# Patient Record
Sex: Male | Born: 1960 | Race: White | Hispanic: Yes | Marital: Married | Smoking: Never smoker
Health system: Southern US, Community
[De-identification: ages and names within clinical notes are randomized; demographics above are authoritative.]

## PROBLEM LIST (undated history)

## (undated) DIAGNOSIS — N390 Urinary tract infection, site not specified: Secondary | ICD-10-CM

## (undated) DIAGNOSIS — N289 Disorder of kidney and ureter, unspecified: Secondary | ICD-10-CM

## (undated) DIAGNOSIS — I471 Supraventricular tachycardia, unspecified: Secondary | ICD-10-CM

## (undated) HISTORY — DX: Supraventricular tachycardia, unspecified: I47.10

## (undated) HISTORY — PX: KIDNEY TRANSPLANT: SHX239

## (undated) HISTORY — DX: Disorder of kidney and ureter, unspecified: N28.9

## (undated) HISTORY — DX: Urinary tract infection, site not specified: N39.0

---

## 1997-05-09 ENCOUNTER — Other Ambulatory Visit: Admission: RE | Admit: 1997-05-09 | Discharge: 1997-05-09 | Payer: Self-pay | Admitting: Nephrology

## 1997-05-11 ENCOUNTER — Other Ambulatory Visit: Admission: RE | Admit: 1997-05-11 | Discharge: 1997-05-11 | Payer: Self-pay | Admitting: *Deleted

## 2000-11-04 ENCOUNTER — Encounter: Payer: Self-pay | Admitting: Nephrology

## 2000-11-04 ENCOUNTER — Encounter: Admission: RE | Admit: 2000-11-04 | Discharge: 2000-11-04 | Payer: Self-pay | Admitting: Nephrology

## 2000-11-20 ENCOUNTER — Emergency Department (HOSPITAL_COMMUNITY): Admission: EM | Admit: 2000-11-20 | Discharge: 2000-11-20 | Payer: Self-pay | Admitting: Emergency Medicine

## 2000-12-02 ENCOUNTER — Encounter: Payer: Self-pay | Admitting: Nephrology

## 2000-12-02 ENCOUNTER — Encounter: Admission: RE | Admit: 2000-12-02 | Discharge: 2000-12-02 | Payer: Self-pay | Admitting: Nephrology

## 2001-03-05 ENCOUNTER — Encounter: Admission: RE | Admit: 2001-03-05 | Discharge: 2001-03-05 | Payer: Self-pay | Admitting: Nephrology

## 2001-03-05 ENCOUNTER — Encounter: Payer: Self-pay | Admitting: Nephrology

## 2001-03-23 ENCOUNTER — Ambulatory Visit (HOSPITAL_COMMUNITY): Admission: RE | Admit: 2001-03-23 | Discharge: 2001-03-23 | Payer: Self-pay | Admitting: Gastroenterology

## 2001-04-09 ENCOUNTER — Encounter: Payer: Self-pay | Admitting: Nephrology

## 2001-04-09 ENCOUNTER — Encounter: Admission: RE | Admit: 2001-04-09 | Discharge: 2001-04-09 | Payer: Self-pay | Admitting: Nephrology

## 2004-06-14 ENCOUNTER — Ambulatory Visit (HOSPITAL_COMMUNITY): Admission: RE | Admit: 2004-06-14 | Discharge: 2004-06-14 | Payer: Self-pay | Admitting: Nephrology

## 2005-02-22 ENCOUNTER — Emergency Department (HOSPITAL_COMMUNITY): Admission: EM | Admit: 2005-02-22 | Discharge: 2005-02-22 | Payer: Self-pay | Admitting: Emergency Medicine

## 2006-09-14 ENCOUNTER — Emergency Department (HOSPITAL_COMMUNITY): Admission: EM | Admit: 2006-09-14 | Discharge: 2006-09-15 | Payer: Self-pay | Admitting: Emergency Medicine

## 2006-11-17 ENCOUNTER — Inpatient Hospital Stay (HOSPITAL_COMMUNITY): Admission: EM | Admit: 2006-11-17 | Discharge: 2006-11-18 | Payer: Self-pay | Admitting: Emergency Medicine

## 2007-04-09 ENCOUNTER — Ambulatory Visit (HOSPITAL_BASED_OUTPATIENT_CLINIC_OR_DEPARTMENT_OTHER): Admission: RE | Admit: 2007-04-09 | Discharge: 2007-04-09 | Payer: Self-pay | Admitting: Orthopedic Surgery

## 2009-06-08 IMAGING — CT CT ABDOMEN W/O CM
2 of 4 series · 17 of 46 positions shown, 19 images · non-contrast
Comparison: None

ABDOMEN CT WITHOUT CONTRAST:

CLINICAL DATA: Abdominal pain. History of renal transplant
TECHNIQUE: Multidetector CT imaging of the abdomen and pelvis was performed
following the standard protocol without oral or intravenous contrast.

[Series 2: abd/pelv w/o 5.0 b31f st · axial · non-contrast · 0.75mm/px · z∈[+888,+1328]mm · 14 of 98 slices shown, 16 images]
[im 5/98  soft-tissue]
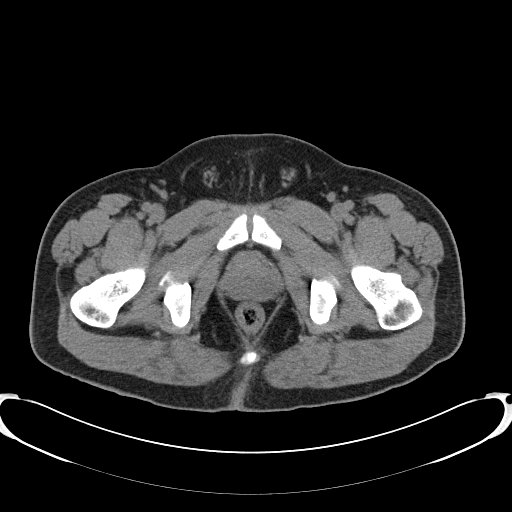
[im 5/98  bone]
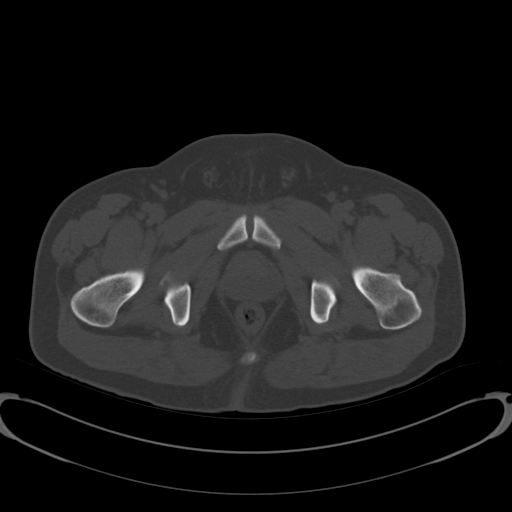
[im 13/98  soft-tissue]
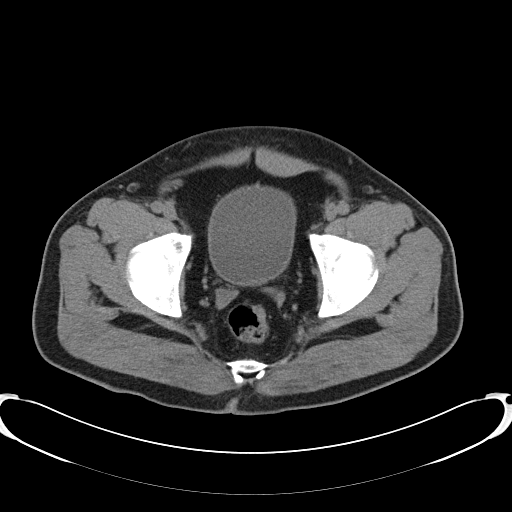
[im 21/98  soft-tissue]
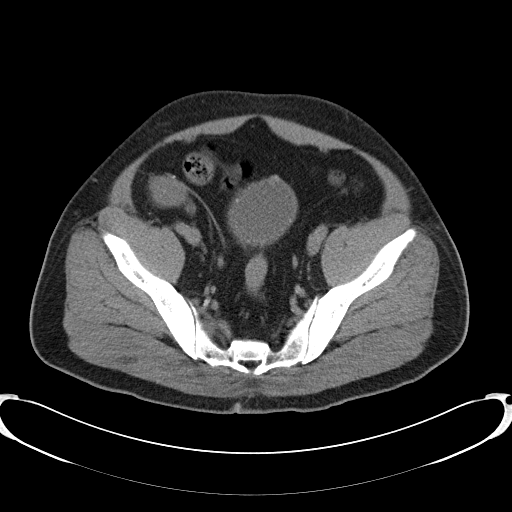
[im 25/98  soft-tissue]
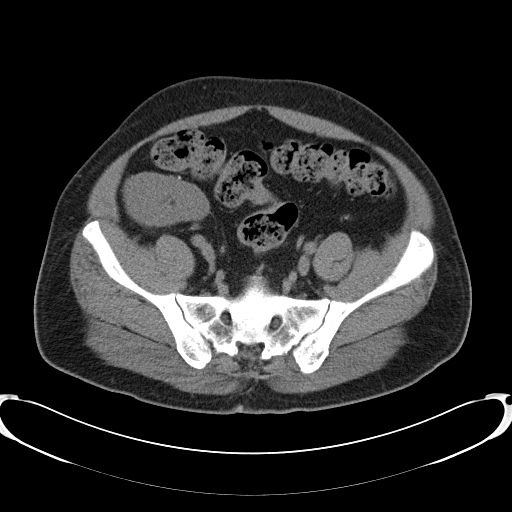
[im 33/98  soft-tissue]
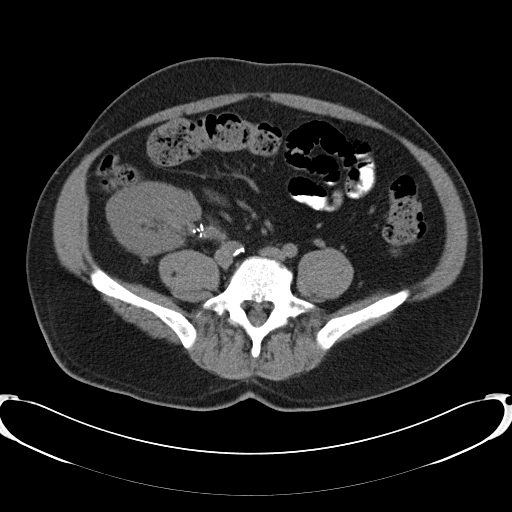
[im 41/98  soft-tissue]
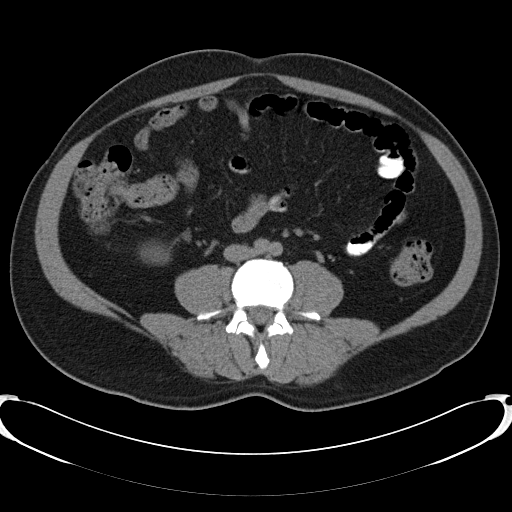
[im 45/98  soft-tissue]
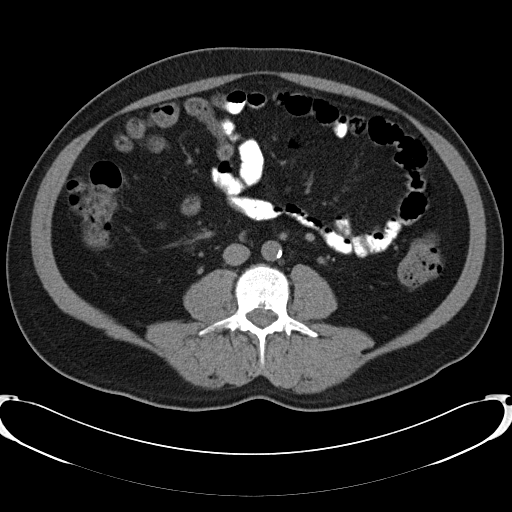
[im 53/98  soft-tissue]
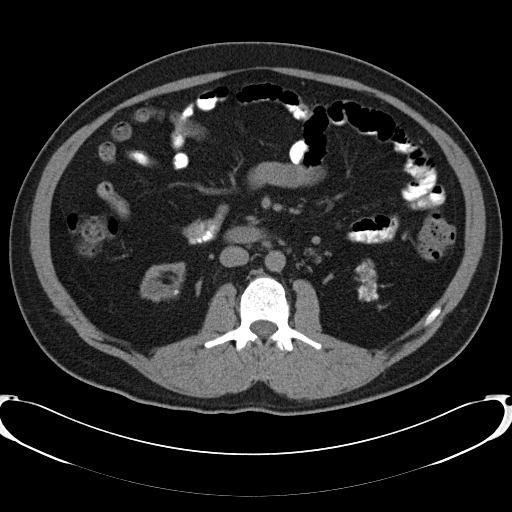
[im 57/98  soft-tissue]
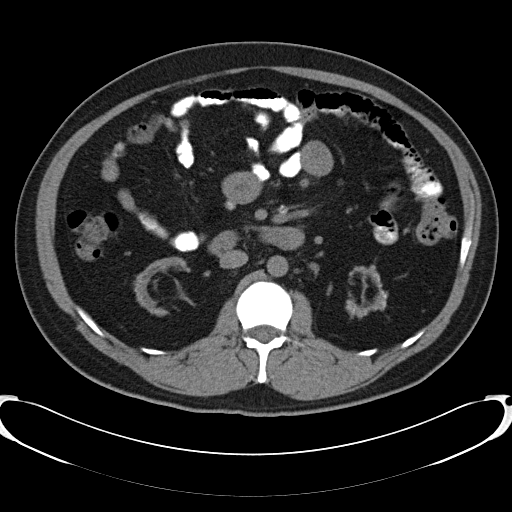
[im 57/98  bone]
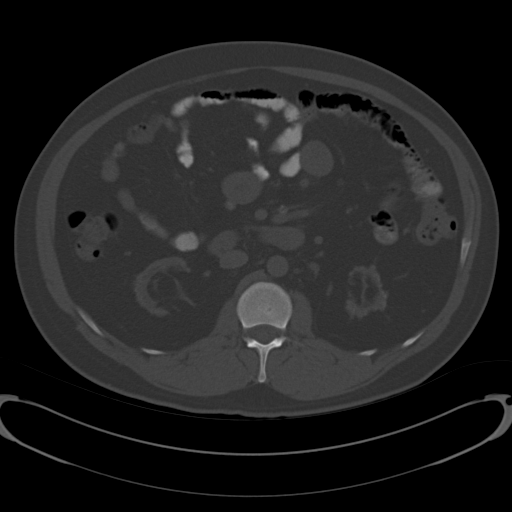
[im 65/98  soft-tissue]
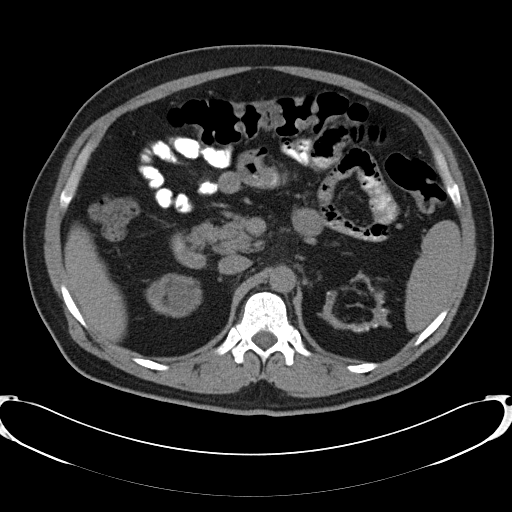
[im 73/98  soft-tissue]
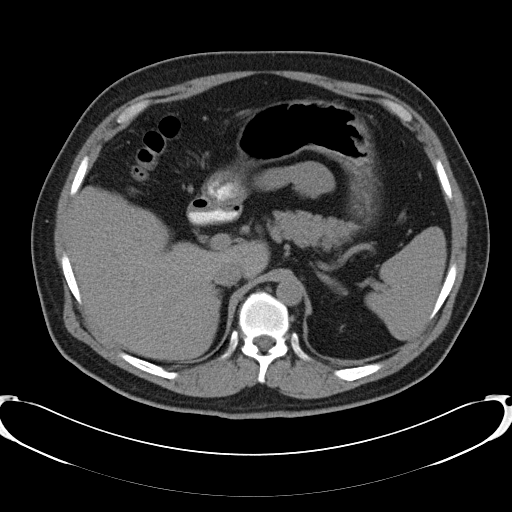
[im 77/98  soft-tissue]
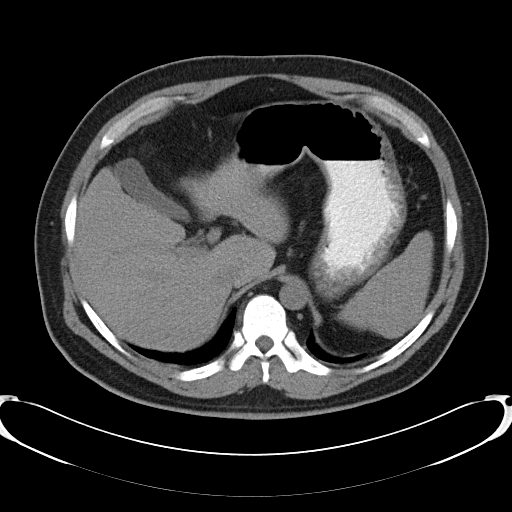
[im 85/98  soft-tissue]
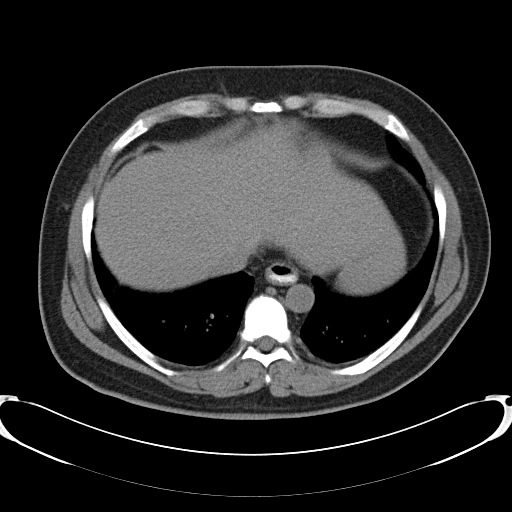
[im 93/98  soft-tissue]
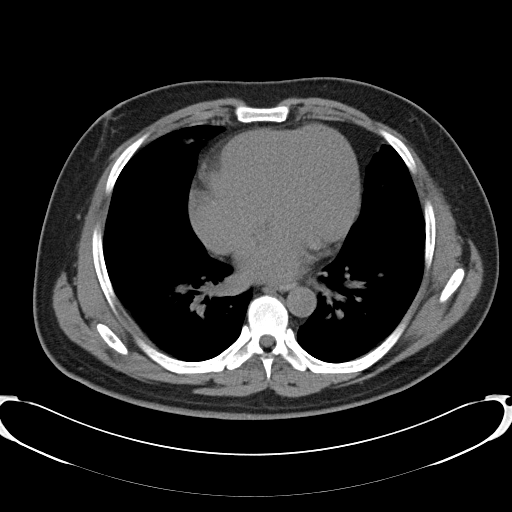

[Series 4: abd/pelv w/o 2.0 spo cor st · coronal · non-contrast · 0.95mm/px · 3 of 136 slices shown]
[im 46/136  soft-tissue]
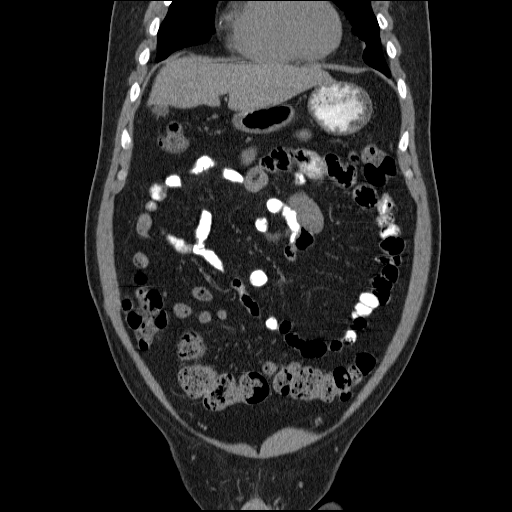
[im 61/136  soft-tissue]
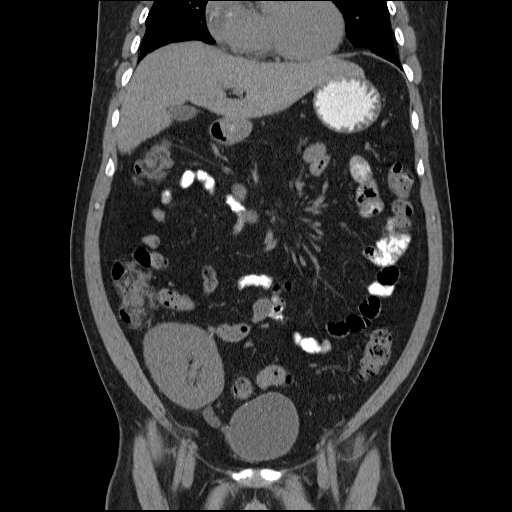
[im 76/136  soft-tissue]
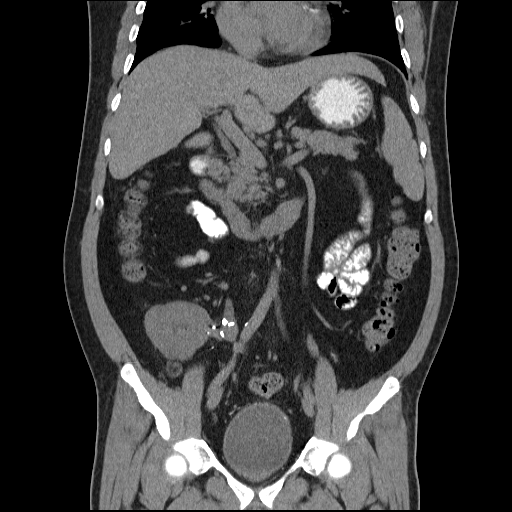

[17 of 46 positions shown; findings below may reference images not displayed]

FINDINGS: Native kidneys are severely atrophic and calcified compatible with
end-stage renal disease. Solid organs otherwise unremarkable. Gallbladder
grossly unremarkable. Bowel grossly unremarkable. No free fluid, free air, or
adenopathy.
IMPRESSION: Severely atrophic kidneys compatible with end-stage renal disease.

PELVIS CT WITHOUT CONTRAST:
FINDINGS: Right lower quadrant renal transplant noted. No hydronephrosis or
evidence of stones. Bladder are grossly unremarkable except for scattered
calcifications within the wall. No free fluid, free air, or adenopathy. Mild
prostate prominence noted. Appendix is normal.
IMPRESSION: Appendix normal.

Scattered calcifications within the bladder wall.

Right lower quadrant renal transplant is unremarkable.

## 2010-06-12 NOTE — H&P (Signed)
NAME:  Adrian Peterson, Adrian Peterson NO.:  [id number]   MEDICAL RECORD NO.:  [id number]          PATIENT TYPE:  INP   LOCATION:  5004                         FACILITY:  [facility]   PHYSICIAN:  Garnetta Buddy, M.D.   DATE OF BIRTH:  November 26, 1960   DATE OF ADMISSION:  11/17/2006  DATE OF DISCHARGE:                              HISTORY & PHYSICAL   PRESENTING COMPLAINT:  Fever, chills, nausea, vomiting for 24 hours.   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  He presented with a 24-hour history of fever, chills,  vomiting x24 hours.   PAST HISTORY:  1. History of renal transplant January 2007.  2. History of secondary parathyroidism.  3. History of hypercalcemia.  4. History of gastroesophageal reflux disease.   ALLERGIES:  No known drug allergies.   MEDICATIONS:  1. Sensipar 30 mg daily.  2. Prograf 4 mg b.i.d.  3. CellCept 150 mg b.i.d.  4. Nexium 40 mg daily.   SOCIAL HISTORY:  Married, no children.  No alcohol, no tobacco.   FAMILY HISTORY:  No end-stage renal disease.   REVIEW OF SYSTEMS:  GENERAL:  Admits to fatigue, weakness, fever, sweats  and chills x24 hours.  EYES:  Denies visual complaints, blurred vision,  double vision.  EARS, NOSE, MOUTH, AND THROAT:  No hearing loss.  No  nasal congestion.  No sinusitis, epistaxis, denies sore throat, or mouth  lesions.  CARDIOVASCULAR:  Denies history of myocardial infarction,  congestive heart failure, heart catheterization, no chest pain.  Denies  any palpitations.  Denies any ankle or leg swelling.  RESPIRATORY:  Denies cough, wheeze, hemoptysis.  No shortness of breath.  No abdominal  pain.  Admits to nausea, loss of appetite, vomiting.  No diarrhea.  No  blood per  rectum.  UROGENITAL:  Urgency, some dysuria, foul-smelling  urine.  Denies of some suprapubic discomfort.  Denies any history of  renal calculi denies any passage of clots.  History of renal transplant,  end-stage renal disease.  NEUROLOGIC: No strokes, seizures, numbness,  tingling, pinpricks, weakness.  DERMATOLOGY:  No skin rashes or itching.  MUSCULOSKELETAL:  No use of nonsteroidal inflammatory drugs.  Complains  of some diffuse arthralgias.  Denies any gout.  HEMATOLOGY/ONCOLOGY:  No  history of cancer, DVT, or pulmonary embolus.  ENDOCRINE:  No history of  diabetes, thyroid disease, or adrenal disease.   PHYSICAL EXAM:  GENERAL:  An alert, very pleasant gentleman, resting  comfortably in bed, answering questions appropriately.  VITAL SIGNS:  Blood pressure 130/80, pulse 75, temperature 100.5,  saturation 97% on room air.  HEAD AND EYES:  Normocephalic, atraumatic.  Pupils round and reactive.  Funduscopic evaluation unremarkable.  No hemorrhages or exudates.  No  emboli.  EARS, NOSE, MOUTH, AND THROAT:  External appearance was normal.  Conjunctivae was normal without any evidence of hemorrhages.  Extraocular movements are intact.  Oropharynx was clear.  Mucosa was dry  nasal mucosa was clear.  NECK:  Supple, no adenopathy.  JVP was not elevated.  CARDIOVASCULAR:  Regular rate and rhythm with a faint systolic murmur  heard in the left sternal region did not radiate.  RESPIRATORY:  Lung fields were clear to auscultation.  No wheezes or  rales to percussion.  Resonant throughout.  ABDOMEN:  Soft, nontender.  Transplanted organ palpable in the right  lower quadrant.  It was mildly tender on deep palpation.  Bowel sounds  were present.  EXTREMITIES:  No cyanosis, clubbing or edema.  GENITAL EVALUATION: Revealed no testicular swelling or tenderness.   MOST RECENT LABS:  WBC 11.3, creatinine 1.2.  Urinalysis 21-50 wbc's,  calcium 10.5   ASSESSMENT AND PLAN:  1. Status post renal  transplantation suspicious for urinary tract      infection or pyelonephritis, does not appear toxic or in shock.      Will admit for sedation and intravenous, and continue antibiotic      therapy.  2. Urinary tract infection.  Will start Cipro 500 mg twice daily.      There is a drug-to-drug interaction with Prograf, will need to keep      this in mind, and check levels frequently.  3. Dehydration.  Continue IV fluids D5 normal saline at 75 mL.  4. Hypercalcemia continue Sensipar 30 mg daily.  Does have a tendency      to cause GI side effects, so will observe for GI side effects with      Sensipar.      Garnetta Buddy, M.D.  Electronically Signed     MWW/MEDQ  D:  11/17/2006  T:  11/18/2006  Job:  [id number]

## 2010-06-12 NOTE — Op Note (Signed)
NAME:  [name], [name]       ACCOUNT NO.:  [id number]   MEDICAL RECORD NO.:  [id number]          PATIENT TYPE:  AMB   LOCATION:  DSC                          FACILITY:  [facility]   PHYSICIAN:  Katy Fitch. Sypher, M.D. 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   POSTOPERATIVE DIAGNOSIS:  Chronic granulation tissue, rule out pyogenic  granuloma.  A small brier was removed from the finger, but no large  splinter was identified.   OPERATION:  Incision and drainage of left index fingertip with  marsupialization of chronic pyogenic granuloma wound.   OPERATIONS:  Josephine Igo, M.D.   ASSISTANT:  Annye Rusk, P.A.-C.   ANESTHESIA:  2% lidocaine metacarpal head level block, left index  finger.  No supplemental sedation.  This was performed as a minor  operating room case at the [facility].   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  He is on Prograf,  CellCept, and another medication from his attending nephrologist, Dr.  Darrick Penna.   He has had a six-week history of a foreign body in his left index  fingertip that he has tried removed by partial nail resection and  working with the needle at home.  Fortunately, he has not developed  cellulitis.  He has, however, had increasing pain and developed a  draining wound with granulation tissue.  He was seen at a urgent care  facility on the evening of April 08, 2007 and advised to seek an urgent  hand surgery consult.  The primary indication for urgent consult was  concern about infection while on his rejection prevention medications  following his renal transplant   Mr. Dains-Ramirez was advised to come to the [facility] for  an acute hand surgery consult.   On clinical examination, he was noted to be an awake and alert 50-year-  old gentleman.  He is fluent in [place].  Inspection of his finger  revealed that he had removed a portion of his radial nail plate and had  a purplish area of granulation tissue that was draining on the radial  aspect of his nail fold.  He stated he thought he could feel a splinter  within the radial pulp.   We advised him to proceed with a minor operating room procedure  following digital block in which we would anticipate removing more nail,  palpating the nail fold and nail matrix, and attempt to remove his  foreign body.  He states that he believes this is a wood splinter from a  2 x 4.   After informed consent, he is brought to Room 3 of the [facility].   PROCEDURE:  Khaliq Turay was placed in supine position upon the  operating table.  After Betadine prep, a 2% lidocaine metacarpal head  level block was placed at the base of left index finger.  After  10  minutes, excellent anesthesia was achieved.  The left arm was then  prepped with Betadine soap solution and sterilely draped.  The left  index finger was exsanguinated with a gauze wrap and a 1/2-inch Penrose  drain placed at the base of the finger as a digital tourniquet.   After assuring that anesthesia was complete, a Therapist, nutritional was used  to elevate the radial half of the nail plate followed by use of scissors  to resect the nail plate.  The sinus tract was quite superficial.  This  had granulation tissue present.  This was carefully palpated with a  micro curette, and all pyogenic granulation type tissue was removed.  I  carefully followed the sinus tract beneath the nail fold but could not  identify a splinter.  I firmly and carefully palpated the entire  fingertip trying to look for signs of a palpable foreign body.   I did find two small briars in the nail fold that  were removed with a  micro rongeur.  I was unable to find a wood splinter that one would  typically expect from lumber.   I unroofed the area of granulation in a marsupialization effort and  dressed the wound open.   Mr. Mierzejewski will return to the office for follow-up in 3 days.  We will begin saline soaks and allow his wound close by secondary  intention.   For aftercare he is provided prescriptions for Vicodin 1 p.o. q.4-6h.  p.r.n. pain 20 tablets without refill.   We will see him back sooner p.r.n. problems.  He was given a map and  written instructions to return to our office on Monday, April 13, 2007.      Katy Fitch Sypher, M.D.  Electronically Signed     RVS/MEDQ  D:  04/09/2007  T:  04/11/2007  Job:  [id number]

## 2010-11-07 LAB — COMPREHENSIVE METABOLIC PANEL
ALT: 17
AST: 27
Albumin: 3.4 — ABNORMAL LOW
Alkaline Phosphatase: 112
BUN: 17
CO2: 25
Calcium: 10.5
Chloride: 103
Creatinine, Ser: 1.16
GFR calc Af Amer: >60
GFR calc non Af Amer: >60
Glucose, Bld: 113 — ABNORMAL HIGH
Potassium: 4
Sodium: 133 — ABNORMAL LOW
Total Bilirubin: 1.1
Total Protein: 6.7

## 2010-11-07 LAB — CBC
HCT: 42.5
HCT: 44.5
Hemoglobin: 14.6
Hemoglobin: 15.5
MCHC: 34.3
MCHC: 34.8
MCV: 86.1
MCV: 86.7
Platelets: 173
Platelets: 181
RBC: 4.9
RBC: 5.16
RDW: 12.9
RDW: 13
WBC: 10.6 — ABNORMAL HIGH
WBC: 11.3 — ABNORMAL HIGH

## 2010-11-07 LAB — URINALYSIS, ROUTINE W REFLEX MICROSCOPIC
Bilirubin Urine: NEGATIVE
Glucose, UA: NEGATIVE
Ketones, ur: NEGATIVE
Nitrite: POSITIVE — AB
Protein, ur: 30 — AB
Specific Gravity, Urine: 1.023
Urobilinogen, UA: 1
pH: 5.5

## 2010-11-07 LAB — CULTURE, BLOOD (ROUTINE X 2)
Culture: NO GROWTH
Culture: NO GROWTH

## 2010-11-07 LAB — URINE CULTURE: Colony Count: >=100000

## 2010-11-07 LAB — RENAL FUNCTION PANEL
Albumin: 2.9 — ABNORMAL LOW
BUN: 15
CO2: 23
Calcium: 10.2
Chloride: 105
Creatinine, Ser: 1.13
GFR calc Af Amer: >60
GFR calc non Af Amer: >60
Glucose, Bld: 108 — ABNORMAL HIGH
Phosphorus: 2.1 — ABNORMAL LOW
Potassium: 4
Sodium: 132 — ABNORMAL LOW

## 2010-11-07 LAB — DIFFERENTIAL
Basophils Absolute: 0
Basophils Relative: 0
Eosinophils Absolute: 0
Eosinophils Relative: 0
Lymphocytes Relative: 12
Lymphs Abs: 1.4
Monocytes Absolute: 1 — ABNORMAL HIGH
Monocytes Relative: 9
Neutro Abs: 8.9 — ABNORMAL HIGH
Neutrophils Relative %: 78 — ABNORMAL HIGH

## 2010-11-07 LAB — URINE MICROSCOPIC-ADD ON

## 2010-11-09 LAB — COMPREHENSIVE METABOLIC PANEL
ALT: 26
AST: 30
Albumin: 3.6
Alkaline Phosphatase: 141 — ABNORMAL HIGH
BUN: 19
CO2: 26
Calcium: 10.6 — ABNORMAL HIGH
Chloride: 106
Creatinine, Ser: 0.96
GFR calc Af Amer: >60
GFR calc non Af Amer: >60
Glucose, Bld: 134 — ABNORMAL HIGH
Potassium: 3.8
Sodium: 135
Total Bilirubin: 0.9
Total Protein: 6.9

## 2010-11-09 LAB — DIFFERENTIAL
Basophils Absolute: 0
Basophils Relative: 0
Eosinophils Absolute: 0
Eosinophils Relative: 0
Lymphocytes Relative: 7 — ABNORMAL LOW
Lymphs Abs: 0.9
Monocytes Absolute: 0.8 — ABNORMAL HIGH
Monocytes Relative: 6
Neutro Abs: 10.9 — ABNORMAL HIGH
Neutrophils Relative %: 86 — ABNORMAL HIGH

## 2010-11-09 LAB — URINALYSIS, ROUTINE W REFLEX MICROSCOPIC
Bilirubin Urine: NEGATIVE
Glucose, UA: NEGATIVE
Hgb urine dipstick: NEGATIVE
Ketones, ur: NEGATIVE
Nitrite: NEGATIVE
Protein, ur: NEGATIVE
Specific Gravity, Urine: 1.026
Urobilinogen, UA: 0.2
pH: 5.5

## 2010-11-09 LAB — URINE CULTURE
Colony Count: NO GROWTH
Culture: NO GROWTH

## 2010-11-09 LAB — CBC
HCT: 46.9
Hemoglobin: 16.2
MCHC: 34.5
MCV: 86.6
Platelets: 193
RBC: 5.42
RDW: 13.4
WBC: 12.6 — ABNORMAL HIGH

## 2010-11-09 LAB — LIPASE, BLOOD: Lipase: 32

## 2010-11-09 LAB — URINE MICROSCOPIC-ADD ON

## 2012-07-28 DIAGNOSIS — N2581 Secondary hyperparathyroidism of renal origin: Secondary | ICD-10-CM | POA: Insufficient documentation

## 2012-07-28 DIAGNOSIS — Z5181 Encounter for therapeutic drug level monitoring: Secondary | ICD-10-CM | POA: Insufficient documentation

## 2012-07-28 DIAGNOSIS — Z940 Kidney transplant status: Secondary | ICD-10-CM | POA: Insufficient documentation

## 2015-04-20 ENCOUNTER — Ambulatory Visit (INDEPENDENT_AMBULATORY_CARE_PROVIDER_SITE_OTHER): Payer: BLUE CROSS/BLUE SHIELD | Admitting: Physician Assistant

## 2015-04-20 VITALS — BP 118/60 | HR 91 | Temp 98.4°F | Resp 18 | Ht 62.0 in | Wt 168.0 lb

## 2015-04-20 DIAGNOSIS — D849 Immunodeficiency, unspecified: Secondary | ICD-10-CM | POA: Diagnosis not present

## 2015-04-20 DIAGNOSIS — J101 Influenza due to other identified influenza virus with other respiratory manifestations: Secondary | ICD-10-CM

## 2015-04-20 DIAGNOSIS — D899 Disorder involving the immune mechanism, unspecified: Secondary | ICD-10-CM

## 2015-04-20 DIAGNOSIS — R6889 Other general symptoms and signs: Secondary | ICD-10-CM

## 2015-04-20 LAB — POCT INFLUENZA A/B
Influenza A, POC: NEGATIVE
Influenza B, POC: POSITIVE — AB

## 2015-04-20 MED ORDER — HYDROCOD POLST-CPM POLST ER 10-8 MG/5ML PO SUER: 5.0000 mL | Freq: Two times a day (BID) | ORAL | Status: DC | PRN

## 2015-04-20 MED ORDER — GUAIFENESIN ER 1200 MG PO TB12: 1.0000 | ORAL_TABLET | Freq: Two times a day (BID) | ORAL | Status: AC

## 2015-04-20 NOTE — Patient Instructions (Addendum)
  Please push fluids.  Tylenol and Motrin for fever and body aches.    A humidifier can help especially when the air is dry -if you do not have a humidifier you can boil a pot of water on the stove in your home to help with the dry air.    IF you received an x-ray today, you will receive an invoice from [facility]. Please contact [facility] at [phone] with questions or concerns regarding your invoice.   IF you received labwork today, you will receive an invoice from [facility]. Please contact [facility] at [phone] with questions or concerns regarding your invoice.   Our billing staff will not be able to assist you with questions regarding bills from these companies.  You will be contacted with the lab results as soon as they are available. The fastest way to get your results is to activate your My Chart account. Instructions are located on the last page of this paperwork. If you have not heard from us regarding the results in 2 weeks, please contact this office.      

## 2015-04-20 NOTE — Progress Notes (Signed)
   Adrian Peterson  MRN: [id number] DOB: [birth year]  Subjective:  Pt presents to clinic with 5 day ho cold symptoms.  He has a cough that is productive with white sputum.  He has nasal congestion with PND with no rhinorrhea.  Irritated throat.  Subjective fevers and chills.  Myalgias all over.  Could not go to work yesterday because he felt so bad.  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 H/O kidney transplant 07/28/2012  . Secondary hyperparathyroidism (HCC) 07/28/2012    No current outpatient prescriptions on file prior to visit.   No current facility-administered medications on file prior to visit.    No Known Allergies  Review of Systems  Constitutional: Positive for fever and chills.  HENT: Positive for congestion, postnasal drip and sore throat. Negative for rhinorrhea.   Respiratory: Positive for cough. Negative for shortness of breath and wheezing.        No asthma, nonsmoker  Gastrointestinal: Positive for diarrhea.  Musculoskeletal: Positive for myalgias.  Neurological: Positive for headaches.   Objective:  BP 118/60 mmHg  Pulse 91  Temp(Src) 98.4 F (36.9 C)  Resp 18  Ht 5\' 2"  (1.575 m)  Wt 168 lb (76.204 kg)  BMI 30.72 kg/m2  SpO2 98%  Physical Exam  Constitutional: He is oriented to person, place, and time and well-developed, well-nourished, and in no distress.  HENT:  Head: Normocephalic and atraumatic.  Right Ear: Hearing, tympanic membrane, external ear and ear canal normal.  Left Ear: Hearing, tympanic membrane, external ear and ear canal normal.  Nose: Nose normal.  Mouth/Throat: Uvula is midline, oropharynx is clear and moist and mucous membranes are normal.  Eyes: Conjunctivae are normal.  Neck: Normal range of motion.  Cardiovascular: Normal rate, regular rhythm and normal heart sounds.     Pulmonary/Chest: Effort normal and breath sounds normal. He has no wheezes.  Lymphadenopathy:       Head (right side): No tonsillar adenopathy present.       Head (left side): No tonsillar adenopathy present.    He has no cervical adenopathy.       Right: No supraclavicular adenopathy present.       Left: No supraclavicular adenopathy present.  Neurological: He is alert and oriented to person, place, and time. Gait normal.  Skin: Skin is warm and dry.  Psychiatric: Mood, memory, affect and judgment normal.    Results for orders placed or performed in visit on 04/20/15  POCT Influenza A/B  Result Value Ref Range   Influenza A, POC Negative Negative   Influenza B, POC Positive (A) Negative    Assessment and Plan :  Flu-like symptoms - Plan: POCT Influenza A/B  Immunocompromised (HCC)  Influenza B - Plan: chlorpheniramine-HYDROcodone (TUSSIONEX PENNKINETIC ER) 10-8 MG/5ML SUER, Guaifenesin (MUCINEX MAXIMUM STRENGTH) 1200 MG TB12   Pt has influenza B and we discussed the length of illness - due to his immunocompromised status he will be mindful that if at this time in the illness he should not be getting worse and if at any time he starts getting worse he should RTC.  We discusses symptomatic care.  Benny LennertSarah Weber PA-C  [facility] 04/20/2015 1:16 PM

## 2015-11-08 ENCOUNTER — Ambulatory Visit (INDEPENDENT_AMBULATORY_CARE_PROVIDER_SITE_OTHER): Payer: BLUE CROSS/BLUE SHIELD | Admitting: Podiatry

## 2015-11-08 ENCOUNTER — Encounter: Payer: Self-pay | Admitting: Podiatry

## 2015-11-08 ENCOUNTER — Ambulatory Visit (INDEPENDENT_AMBULATORY_CARE_PROVIDER_SITE_OTHER): Payer: BLUE CROSS/BLUE SHIELD

## 2015-11-08 VITALS — BP 132/69 | HR 86 | Resp 16 | Ht 62.0 in | Wt 169.0 lb

## 2015-11-08 DIAGNOSIS — M722 Plantar fascial fibromatosis: Secondary | ICD-10-CM

## 2015-11-08 DIAGNOSIS — M79672 Pain in left foot: Secondary | ICD-10-CM

## 2015-11-08 MED ORDER — TRIAMCINOLONE ACETONIDE 10 MG/ML IJ SUSP
10.0000 mg | Freq: Once | INTRAMUSCULAR | Status: AC
  Administered 2015-11-08: 10 mg

## 2015-11-08 NOTE — Progress Notes (Signed)
   Subjective:    Patient ID: Adrian Peterson, male    DOB: 17-Feb-1960, 55 y.o.   MRN: [id number]  HPI Chief Complaint  Patient presents with  . Foot Pain    Left foot; heel; pt stated, "Hurts on and off all day for the past 2 months"      Review of Systems  All other systems reviewed and are negative.      Objective:   Physical Exam        Assessment & Plan:

## 2015-11-08 NOTE — Progress Notes (Signed)
Subjective:     Patient ID: Baldo Ashsidro Dantonio-Ramirez, male   DOB: 1960/08/05, 55 y.o.   MRN: [id number]  HPI patient presents stating I'm having a lot of pain still in my heel left when I try to be active   Review of Systems  All other systems reviewed and are negative.      Objective:   Physical Exam  Constitutional: He is oriented to person, place, and time.  Cardiovascular: Intact distal pulses.   Musculoskeletal: Normal range of motion.  Neurological: He is oriented to person, place, and time.  Skin: Skin is warm.  Nursing note and vitals reviewed.  neurovascular status intact muscle strength adequate range of motion within normal limits with patient found to have exquisite discomfort plantar aspect left heel at the insertional point of the tendon into the calcaneus. Patient's found to have good digital perfusion is well oriented 3     Assessment:     Acute plantar fasciitis left with inflammation fluid around the medial band    Plan:     H&P x-rays reviewed and today I injected the plantar fascial left 3 mg Kenalog 5 mg Xylocaine and applied fascial brace. Gave instructions on physical therapy and reappoint to recheck  X-ray report indicate spur formation with no indications of stress fracture arthritis

## 2015-11-08 NOTE — Patient Instructions (Signed)

## 2015-11-22 ENCOUNTER — Ambulatory Visit (INDEPENDENT_AMBULATORY_CARE_PROVIDER_SITE_OTHER): Payer: BLUE CROSS/BLUE SHIELD | Admitting: Podiatry

## 2015-11-22 ENCOUNTER — Encounter: Payer: Self-pay | Admitting: Podiatry

## 2015-11-22 DIAGNOSIS — M722 Plantar fascial fibromatosis: Secondary | ICD-10-CM | POA: Diagnosis not present

## 2015-11-22 DIAGNOSIS — M79672 Pain in left foot: Secondary | ICD-10-CM

## 2015-11-22 MED ORDER — TRIAMCINOLONE ACETONIDE 10 MG/ML IJ SUSP
10.0000 mg | Freq: Once | INTRAMUSCULAR | Status: AC
  Administered 2015-11-22: 10 mg

## 2015-11-23 NOTE — Progress Notes (Signed)
Subjective:     Patient ID: Adrian Peterson, male   DOB: 04/08/1960, 55 y.o.   MRN: [id number]  HPI patient states he still is having heel pain but it's improved from previous visit   Review of Systems     Objective:   Physical Exam Neurovascular status intact muscle strength adequate with discomfort in the plantar heel left that's improving but still present    Assessment:     Fasciitis improved but present    Plan:     Reinjected the plantar fascia 3 Milligan Kenalog 5 mill grams Xylocaine advised long-term physical therapy shoe gear modifications and that most likely he will take orthotics to try to support the arch. Reappoint in 4-5 weeks to reevaluate and see response to continue conservative treatment

## 2015-12-27 ENCOUNTER — Encounter: Payer: Self-pay | Admitting: Podiatry

## 2015-12-27 ENCOUNTER — Ambulatory Visit (INDEPENDENT_AMBULATORY_CARE_PROVIDER_SITE_OTHER): Payer: BLUE CROSS/BLUE SHIELD | Admitting: Podiatry

## 2015-12-27 DIAGNOSIS — M722 Plantar fascial fibromatosis: Secondary | ICD-10-CM | POA: Diagnosis not present

## 2015-12-27 DIAGNOSIS — M79672 Pain in left foot: Secondary | ICD-10-CM

## 2015-12-28 NOTE — Progress Notes (Signed)
Subjective:     Patient ID: Adrian Peterson, male   DOB: 1960-12-11, 55 y.o.   MRN: [id number]  HPI patient presents stating my heel is improving but it is still tender   Review of Systems     Objective:   Physical Exam Neurovascular status intact muscle strength is adequate with patient's left heel still been sore when palpated for to long but overall healing well with moderate pain and flatfoot deformity    Assessment:     Plantar fasciitis left improved but present    Plan:     Advised on physical therapy anti-inflammatories and scanned for custom orthotics to reduce plantar pressure. Reappoint when ready

## 2016-01-17 ENCOUNTER — Ambulatory Visit (INDEPENDENT_AMBULATORY_CARE_PROVIDER_SITE_OTHER): Payer: Self-pay | Admitting: Podiatry

## 2016-01-17 DIAGNOSIS — M722 Plantar fascial fibromatosis: Secondary | ICD-10-CM

## 2016-01-17 NOTE — Patient Instructions (Signed)

## 2016-02-09 NOTE — Progress Notes (Signed)
Patient presents for orthotic pick up.  Verbal and written break in and wear instructions given.  Patient will follow up in 4 weeks if symptoms worsen or fail to improve. 

## 2017-11-06 DIAGNOSIS — I10 Essential (primary) hypertension: Secondary | ICD-10-CM | POA: Insufficient documentation

## 2018-02-04 ENCOUNTER — Encounter: Payer: Self-pay | Admitting: Internal Medicine

## 2018-02-23 ENCOUNTER — Ambulatory Visit: Payer: Self-pay | Admitting: Internal Medicine

## 2018-04-12 DIAGNOSIS — R200 Anesthesia of skin: Secondary | ICD-10-CM | POA: Insufficient documentation

## 2018-04-12 DIAGNOSIS — E559 Vitamin D deficiency, unspecified: Secondary | ICD-10-CM | POA: Insufficient documentation

## 2018-05-13 DIAGNOSIS — N319 Neuromuscular dysfunction of bladder, unspecified: Secondary | ICD-10-CM | POA: Insufficient documentation

## 2018-10-06 DIAGNOSIS — M47812 Spondylosis without myelopathy or radiculopathy, cervical region: Secondary | ICD-10-CM | POA: Insufficient documentation

## 2018-10-06 DIAGNOSIS — N390 Urinary tract infection, site not specified: Secondary | ICD-10-CM | POA: Insufficient documentation

## 2018-10-06 DIAGNOSIS — I6523 Occlusion and stenosis of bilateral carotid arteries: Secondary | ICD-10-CM | POA: Insufficient documentation

## 2018-11-01 DIAGNOSIS — D849 Immunodeficiency, unspecified: Secondary | ICD-10-CM | POA: Insufficient documentation

## 2018-12-16 DIAGNOSIS — R7301 Impaired fasting glucose: Secondary | ICD-10-CM | POA: Insufficient documentation

## 2020-04-19 ENCOUNTER — Observation Stay (HOSPITAL_COMMUNITY)
Admission: EM | Admit: 2020-04-19 | Discharge: 2020-04-20 | Disposition: A | Discharge status: Home / Self Care | Payer: BLUE CROSS/BLUE SHIELD | Attending: Cardiology | Admitting: Cardiology

## 2020-04-19 ENCOUNTER — Other Ambulatory Visit: Payer: Self-pay

## 2020-04-19 ENCOUNTER — Emergency Department (HOSPITAL_COMMUNITY): Payer: BLUE CROSS/BLUE SHIELD

## 2020-04-19 ENCOUNTER — Encounter (HOSPITAL_COMMUNITY): Payer: Self-pay

## 2020-04-19 DIAGNOSIS — Z79899 Other long term (current) drug therapy: Secondary | ICD-10-CM | POA: Diagnosis not present

## 2020-04-19 DIAGNOSIS — I10 Essential (primary) hypertension: Secondary | ICD-10-CM | POA: Diagnosis not present

## 2020-04-19 DIAGNOSIS — Z20822 Contact with and (suspected) exposure to covid-19: Secondary | ICD-10-CM | POA: Diagnosis not present

## 2020-04-19 DIAGNOSIS — N138 Other obstructive and reflux uropathy: Secondary | ICD-10-CM | POA: Insufficient documentation

## 2020-04-19 DIAGNOSIS — N2581 Secondary hyperparathyroidism of renal origin: Secondary | ICD-10-CM | POA: Diagnosis present

## 2020-04-19 DIAGNOSIS — I471 Supraventricular tachycardia: Principal | ICD-10-CM | POA: Insufficient documentation

## 2020-04-19 DIAGNOSIS — Z940 Kidney transplant status: Secondary | ICD-10-CM | POA: Diagnosis not present

## 2020-04-19 DIAGNOSIS — E211 Secondary hyperparathyroidism, not elsewhere classified: Secondary | ICD-10-CM | POA: Insufficient documentation

## 2020-04-19 LAB — TSH: TSH: 1.854 u[IU]/mL (ref 0.350–4.500)

## 2020-04-19 LAB — CBC
HCT: 46.2 % (ref 39.0–52.0)
Hemoglobin: 15.1 g/dL (ref 13.0–17.0)
MCH: 28.8 pg (ref 26.0–34.0)
MCHC: 32.7 g/dL (ref 30.0–36.0)
MCV: 88.2 fL (ref 80.0–100.0)
Platelets: 241 10*3/uL (ref 150–400)
RBC: 5.24 MIL/uL (ref 4.22–5.81)
RDW: 13.2 % (ref 11.5–15.5)
WBC: 12.2 10*3/uL — ABNORMAL HIGH (ref 4.0–10.5)
nRBC: 0 % (ref 0.0–0.2)

## 2020-04-19 LAB — TROPONIN I (HIGH SENSITIVITY)
Troponin I (High Sensitivity): 12 ng/L (ref <18)
Troponin I (High Sensitivity): 127 ng/L (ref <18)
Troponin I (High Sensitivity): 252 ng/L (ref <18)

## 2020-04-19 LAB — BASIC METABOLIC PANEL
Anion gap: 7 (ref 5–15)
BUN: 25 mg/dL — ABNORMAL HIGH (ref 6–20)
CO2: 19 mmol/L — ABNORMAL LOW (ref 22–32)
Calcium: 10.7 mg/dL — ABNORMAL HIGH (ref 8.9–10.3)
Chloride: 110 mmol/L (ref 98–111)
Creatinine, Ser: 1.22 mg/dL (ref 0.61–1.24)
GFR, Estimated: >60 mL/min (ref >60)
Glucose, Bld: 162 mg/dL — ABNORMAL HIGH (ref 70–99)
Potassium: 4.2 mmol/L (ref 3.5–5.1)
Sodium: 136 mmol/L (ref 135–145)

## 2020-04-19 LAB — MAGNESIUM: Magnesium: 1.9 mg/dL (ref 1.7–2.4)

## 2020-04-19 MED ORDER — ADENOSINE 6 MG/2ML IV SOLN
INTRAVENOUS | Status: AC
  Administered 2020-04-19: 6 mg via INTRAVENOUS
  Filled 2020-04-19: qty 6

## 2020-04-19 NOTE — ED Notes (Signed)
Cards at bedside

## 2020-04-19 NOTE — ED Triage Notes (Signed)
Patient complains of cp and palpitations x 50 minutes. Has had same in past but normally only lat 3-5 minutes per interpretor. Patient alert and oriented, also complains of SOB

## 2020-04-19 NOTE — ED Notes (Signed)
Pt noted to be in SVT upon arrival to room. HR in the 200-210 range. Dr. Wilkie Aye at bedside. 6 mg Adenosine given with conversion to NSR.

## 2020-04-19 NOTE — ED Provider Notes (Signed)
[facility] EMERGENCY DEPARTMENT Provider Note   CSN: [id number] Arrival date & time: 04/19/20  1646     History No chief complaint on file.   Salvador Coupe is a 60 y.o. male.  HPI   60 year old Spanish-speaking male with past medical history of end-stage renal disease status post kidney transplant presents the emergency department with racing heartbeat and chest pain.  Interpreter used.  Patient states that for the past week he has been having episodes of racing heartbeat but usually only last 3 to 5 minutes and then self resolves.  About an hour ago the racing heartbeat started and it did not subside and now he is having chest pain.  Patient denies any history of heart problems or arrhythmias in the past.  He has an AV fistula in the left upper extremity but states he has not required dialysis and receiving his kidney transplant.  He otherwise denies any acute illness in the past week.  History reviewed. No pertinent past medical history.  Patient Active Problem List   Diagnosis Date Noted  . H/O kidney transplant 07/28/2012  . Secondary hyperparathyroidism (HCC) 07/28/2012    History reviewed. No pertinent surgical history.     No family history on file.  Social History   Tobacco Use  . Smoking status: Never Smoker    Home Medications Prior to Admission medications   Medication Sig Start Date End Date Taking? Authorizing Provider  cinacalcet (SENSIPAR) 30 MG tablet Take 30 mg by mouth 2 (two) times daily with a meal.    [provider]  mycophenolate (CELLCEPT) 500 MG tablet Take 500 mg by mouth 2 (two) times daily.    [provider]  tacrolimus (PROGRAF) 1 MG capsule Take 2 mg by mouth 2 (two) times daily.    [provider]    Allergies    Patient has no known allergies.  Review of Systems   Review of Systems  Unable to perform ROS: Acuity of condition    Physical Exam Updated Vital Signs BP (!) 134/93  (BP Location: Right Arm)   Pulse 92   Temp 98.4 F (36.9 C) (Oral)   Resp (!) 23   SpO2 98%   Physical Exam Vitals and nursing note reviewed.  Constitutional:      Appearance: Normal appearance.  HENT:     Head: Normocephalic.     Mouth/Throat:     Mouth: Mucous membranes are moist.  Cardiovascular:     Rate and Rhythm: Regular rhythm. Tachycardia present.  Pulmonary:     Comments: Tachypneic, equal breath sounds Abdominal:     Palpations: Abdomen is soft.     Tenderness: There is no abdominal tenderness.  Musculoskeletal:        General: No swelling.  Skin:    General: Skin is warm.  Neurological:     Mental Status: He is alert and oriented to person, place, and time. Mental status is at baseline.  Psychiatric:        Mood and Affect: Mood normal.     ED Results / Procedures / Treatments   Labs (all labs ordered are listed, but only abnormal results are displayed) Labs Reviewed  CBC - Abnormal; Notable for the following components:      Result Value   WBC 12.2 (*)    All other components within normal limits  BASIC METABOLIC PANEL  MAGNESIUM  TSH  TROPONIN I (HIGH SENSITIVITY)    EKG None  Radiology No results found.  Procedures .Critical Care Performed by: Rozelle Logan, DO Authorized by: Rozelle Logan, DO   Critical care provider statement:    Critical care time (minutes):  45   Critical care was time spent personally by me on the following activities:  Discussions with consultants, evaluation of patient's response to treatment, examination of patient, ordering and performing treatments and interventions, ordering and review of laboratory studies, ordering and review of radiographic studies, pulse oximetry, re-evaluation of patient's condition, obtaining history from patient or surrogate and review of old charts   I assumed direction of critical care for this patient from another provider in my specialty: no       Medications Ordered in  ED Medications  adenosine (ADENOCARD) 6 MG/2ML injection (6 mg Intravenous Given 04/19/20 1704)    ED Course  I have reviewed the triage vital signs and the nursing notes.  Pertinent labs & imaging results that were available during my care of the patient were reviewed by me and considered in my medical decision making (see chart for details).    MDM Rules/Calculators/A&P                          60 year old primarily Spanish-speaking male presents the emergency department accompanied by his spouse for racing heartbeat and chest pain.  On arrival heart rate was over 200, EKG shows SVT with a narrow QRS.  Interpreter used, patient given 6 mg of adenosine.  Had great conversion to normal sinus rhythm with normal intervals.  Chest pain subsided, tachypnea resolved.  Blood work is reassuring, no anemia, no acute electrolyte abnormalities, magnesium is normal, TSH is normal.  First troponin was 12 but this did elevate to 127 on the 90-minute.  Consulted cardiology and they recommend 3-hour troponin and reevaluation.  3-hour troponin is uptrending.  Patient does not appear to have any previous cardiac work-up or echo.  Cardiology has seen the patient and recommends admission.  Patient is amendable to staying.  Patients evaluation and results requires admission for further treatment and care. Patient agrees with admission plan, offers no new complaints and is stable/unchanged at time of admit.  Final Clinical Impression(s) / ED Diagnoses Final diagnoses:  None    Rx / DC Orders ED Discharge Orders    None       Hanah Moultry, Clabe Seal, DO 04/20/20 0000

## 2020-04-20 ENCOUNTER — Observation Stay (HOSPITAL_BASED_OUTPATIENT_CLINIC_OR_DEPARTMENT_OTHER): Payer: BLUE CROSS/BLUE SHIELD

## 2020-04-20 DIAGNOSIS — I471 Supraventricular tachycardia: Secondary | ICD-10-CM

## 2020-04-20 DIAGNOSIS — I350 Nonrheumatic aortic (valve) stenosis: Secondary | ICD-10-CM | POA: Diagnosis not present

## 2020-04-20 DIAGNOSIS — R079 Chest pain, unspecified: Secondary | ICD-10-CM

## 2020-04-20 DIAGNOSIS — I361 Nonrheumatic tricuspid (valve) insufficiency: Secondary | ICD-10-CM

## 2020-04-20 DIAGNOSIS — I340 Nonrheumatic mitral (valve) insufficiency: Secondary | ICD-10-CM | POA: Diagnosis not present

## 2020-04-20 DIAGNOSIS — Z940 Kidney transplant status: Secondary | ICD-10-CM

## 2020-04-20 LAB — ECHOCARDIOGRAM COMPLETE
AR max vel: 2.31 cm2
AV Area VTI: 2.19 cm2
AV Area mean vel: 2.3 cm2
AV Mean grad: 9 mmHg
AV Peak grad: 19.7 mmHg
Ao pk vel: 2.22 m/s
Area-P 1/2: 3.03 cm2
MV VTI: 2.05 cm2
S' Lateral: 3.1 cm

## 2020-04-20 LAB — BASIC METABOLIC PANEL
Anion gap: 3 — ABNORMAL LOW (ref 5–15)
BUN: 24 mg/dL — ABNORMAL HIGH (ref 6–20)
CO2: 19 mmol/L — ABNORMAL LOW (ref 22–32)
Calcium: 9.8 mg/dL (ref 8.9–10.3)
Chloride: 114 mmol/L — ABNORMAL HIGH (ref 98–111)
Creatinine, Ser: 1.2 mg/dL (ref 0.61–1.24)
GFR, Estimated: >60 mL/min (ref >60)
Glucose, Bld: 104 mg/dL — ABNORMAL HIGH (ref 70–99)
Potassium: 4.7 mmol/L (ref 3.5–5.1)
Sodium: 136 mmol/L (ref 135–145)

## 2020-04-20 LAB — CBC
HCT: 42.9 % (ref 39.0–52.0)
Hemoglobin: 13.7 g/dL (ref 13.0–17.0)
MCH: 28.7 pg (ref 26.0–34.0)
MCHC: 31.9 g/dL (ref 30.0–36.0)
MCV: 89.9 fL (ref 80.0–100.0)
Platelets: 219 10*3/uL (ref 150–400)
RBC: 4.77 MIL/uL (ref 4.22–5.81)
RDW: 13.4 % (ref 11.5–15.5)
WBC: 7.3 10*3/uL (ref 4.0–10.5)
nRBC: 0 % (ref 0.0–0.2)

## 2020-04-20 LAB — SARS CORONAVIRUS 2 (TAT 6-24 HRS): SARS Coronavirus 2: NEGATIVE

## 2020-04-20 LAB — TROPONIN I (HIGH SENSITIVITY): Troponin I (High Sensitivity): 107 ng/L (ref <18)

## 2020-04-20 MED ORDER — TACROLIMUS 1 MG PO CAPS
2.0000 mg | ORAL_CAPSULE | Freq: Two times a day (BID) | ORAL | Status: DC
  Administered 2020-04-20: 2 mg via ORAL
  Filled 2020-04-20 (×2): qty 2

## 2020-04-20 MED ORDER — METOPROLOL TARTRATE 25 MG PO TABS
12.5000 mg | ORAL_TABLET | Freq: Two times a day (BID) | ORAL | Status: DC
  Administered 2020-04-20: 12.5 mg via ORAL
  Filled 2020-04-20: qty 1

## 2020-04-20 MED ORDER — METOPROLOL TARTRATE 25 MG PO TABS: 12.5000 mg | ORAL_TABLET | Freq: Two times a day (BID) | ORAL | 3 refills | Status: DC

## 2020-04-20 MED ORDER — MYCOPHENOLATE MOFETIL 250 MG PO CAPS
500.0000 mg | ORAL_CAPSULE | Freq: Two times a day (BID) | ORAL | Status: DC
  Administered 2020-04-20: 500 mg via ORAL
  Filled 2020-04-20 (×2): qty 2

## 2020-04-20 MED ORDER — SODIUM CHLORIDE 0.9% FLUSH
3.0000 mL | Freq: Two times a day (BID) | INTRAVENOUS | Status: DC
  Administered 2020-04-20: 3 mL via INTRAVENOUS

## 2020-04-20 NOTE — H&P (Signed)
Cardiology Admission History and Physical:   Patient ID: Adrian Peterson MRN: [id number]; DOB: January 31, 1960   Admission date: 04/19/2020  PCP:  Beryle Lathe, MD   [facility]  Cardiologist:  No primary care provider on file.  Advanced Practice Provider:  No care team member to display Electrophysiologist:  None   Chief Complaint: chest discomfort  Patient Profile:  Adrian Peterson is a 60 y.o. male with a hx of deceased donor kidney transplant 02/06/2009 at [facility] as well as hypertension who is being seen today for the evaluation of SVT and troponin elevation.  History of Present Illness:   Mr. Adrian Peterson present to the emergency department this evening after an hour of chest discomfort, lightheadedness, and generally feeling unwell.  He was found to be in rapid SVT with heart rate approximately 200.  He received 6 mg of IV adenosine with successful conversion to sinus rhythm.  He had some residual chest discomfort immediately afterward, but is currently chest pain-free and feeling well.  He was noted to have elevated and uptrending troponins.  He reports that he has had at least 3 similar episodes in the past 2 months.  He did not seek medical care for those episodes because they lasted a shorter duration than the one that brought him into the emergency department this evening.  He has been told that he has high blood pressure, but otherwise he has no known cardiac history.  No recent viral symptoms or illness.   Medical History  1. s/p deceased donor kidney transplant 02/06/2005. congenital obstructive uropathy. alemtuzumab induction. baseline creatinine 1 mg/dl.  2. chr bladder outlet obstruction. performs i+o bladder cath 4x daily.  3. hypertension  4. secondary/tertiary hyperparathyroidism   Surgical history:  Deceased donor kidney transplant 02/06/2005   Medications Prior to Admission: Prior to Admission medications   Medication Sig  Start Date End Date Taking? Authorizing Provider  mycophenolate (CELLCEPT) 500 MG tablet Take 500 mg by mouth 2 (two) times daily.   Yes [provider]  tacrolimus (PROGRAF) 1 MG capsule Take 2 mg by mouth 2 (two) times daily.   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  He has never smoked.  His wife is here at bedside with him. Social History   Socioeconomic History  . Marital status: Married    Spouse name: Not on file  . Number of children: Not on file  . Years of education: Not on file  . Highest education level: Not on file  Occupational History  . Not on file  Tobacco Use  . Smoking status: Never Smoker  . Smokeless tobacco: Not on file  Substance and Sexual Activity  . Alcohol use: Not on file  . Drug use: Not on file  . Sexual activity: Not on file  Other Topics Concern  . Not on file  Social History Narrative  . Not on file   Social Determinants of Health   Financial Resource Strain: Not on file  Food Insecurity: Not on file  Transportation Needs: Not on file  Physical Activity: Not on file  Stress: Not on file  Social Connections: Not on file  Intimate Partner Violence: Not on file    Family History:  No known family history of heart disease.  No known family history of abnormal heart rhythms.   ROS:  Please see the history of present illness.  All other ROS reviewed and negative.     Physical Exam/Data:   Vitals:   04/20/20 0300 04/20/20 0345 04/20/20 0400 04/20/20 0521  BP: 109/65 125/75 115/66   Pulse: 65 69 66   Resp: 18 18 19    Temp:    98 F (36.7 C)  TempSrc:    Oral  SpO2: 95% 98% 97%    No intake or output data in the 24 hours ending 04/20/20 0640 Last 3 Weights 11/08/2015 04/20/2015  Weight (lbs) 169 lb 168 lb  Weight (kg) 76.658 kg 76.204 kg     There is no height or weight on file to calculate BMI.  General:  Well nourished,  well developed, in no acute distress HEENT: normal Lymph: no adenopathy Neck: no JVD Endocrine:  No thryomegaly Vascular: No carotid bruits; FA pulses 2+ bilaterally without bruits  Cardiac:  normal S1, S2; RRR; no murmur  Lungs:  clear to auscultation bilaterally, no wheezing, rhonchi or rales  Abd: soft, nontender, no hepatomegaly  Ext: no edema. Old LUE fistula with thrill Musculoskeletal:  No deformities, BUE and BLE strength normal and equal Skin: warm and dry  Neuro:  CNs 2-12 intact, no focal abnormalities noted Psych:  Normal affect   EKG:  The EKG was personally reviewed and demonstrates: narrow complex tachycardia at a rate of 210 bpm with the appearance of retrograde p waves visible in multiple leads. ECG after adenosine demonstrates sinus tachycardia with baseline noise and PVC.   Telemetry:  Telemetry was personally reviewed and demonstrates:  Sinus tachycardia and sinus rhythm.   Relevant CV Studies: none  Laboratory Data:  High Sensitivity Troponin:   Recent Labs  Lab 04/19/20 1657 04/19/20 1857 04/19/20 2206  TROPONINIHS 12 127* 252*      Chemistry Recent Labs  Lab 04/19/20 1657 04/20/20 0503  NA 136 136  K 4.2 4.7  CL 110 114*  CO2 19* 19*  GLUCOSE 162* 104*  BUN 25* 24*  CREATININE 1.22 1.20  CALCIUM 10.7* 9.8  GFRNONAA >60 >60  ANIONGAP 7 3*    Hematology Recent Labs  Lab 04/19/20 1657 04/20/20 0503  WBC 12.2* 7.3  RBC 5.24 4.77  HGB 15.1 13.7  HCT 46.2 42.9  MCV 88.2 89.9  MCH 28.8 28.7  MCHC 32.7 31.9  RDW 13.2 13.4  PLT 241 219    Radiology/Studies:  DG Chest 2 View  Result Date: 04/19/2020 CLINICAL DATA:  Chest pain EXAM: CHEST - 2 VIEW COMPARISON:  11/17/2006 FINDINGS: Heart is upper limits normal in size. No confluent airspace opacities or effusions. No edema. No acute bony abnormality. IMPRESSION: No active cardiopulmonary disease. Electronically Signed   By: 11/19/2006 M.D.   On: 04/19/2020 17:38     Assessment and  Plan:   60 year old gentleman with distant history of kidney transplant presenting with symptomatic SVT now status post successful cardioversion to sinus rhythm with adenosine.  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  His chest discomfort is most likely related to rapid rates with some demand ischemia; similarly, his troponin elevation is most likely due to demand ischemia.  However, he has no recent cardiac work-up that I can find.  He has no ongoing chest pain and no findings consistent with heart failure.  Therefore, I would like to observe him overnight while trending out his troponins until they peak.  I would like to get a transthoracic echo in the morning to rule out the presence of structural heart disease as a  trigger for SVT with crescendo frequency and duration.  We will also start him on beta-blockade.  If the above evaluation is normal, we will discharge him to home with cardiology follow-up to discuss long-term management of SVT.  1. SVT - Start metoprolol tartrate 12.5 mg twice daily  - Transthoracic echo - Trend troponin  - Outpatient cardiology follow up  2. Status post kidney transplant - Continue home mycophenolate 500 mg BID and tacrolimus 2 mg BID   Severity of Illness: The appropriate patient status for this patient is OBSERVATION. Observation status is judged to be reasonable and necessary in order to provide the required intensity of service to ensure the patient's safety. The patient's presenting symptoms, physical exam findings, and initial radiographic and laboratory data in the context of their medical condition is felt to place them at decreased risk for further clinical deterioration. Furthermore, it is anticipated that the patient will be medically stable for discharge from the hospital within 2 midnights of admission. The following factors support the patient status of observation.   " The patient's presenting symptoms include chest discomfort. " The  physical exam findings include SVT with heart rate 200 bpm. " The initial radiographic and laboratory data are troponin elevation.  For questions or updates, please contact [facility] Please consult [email or web address] for contact info under     Signed, Cynda Acres, MD  04/20/2020 6:40 AM

## 2020-04-20 NOTE — Progress Notes (Signed)
  Echocardiogram 2D Echocardiogram has been performed.  Adrian Peterson 04/20/2020, 2:43 PM

## 2020-04-20 NOTE — Progress Notes (Signed)
    Seen overnight by cardiology fellow for the evaluation of chest pain found to be in SVT given adenosine with conversion to NSR. He has been maintaining NSR with no recurrent symptoms. He was placed on metoprolol. HsT trending down with a peak at 252. Plan for echocardiogram today to assess structural causes of SVT. Will follow results.    Georgie Chard NP-C [facility] Pager: [phone]

## 2020-04-20 NOTE — Consult Note (Addendum)
Cardiology Consultation:   Patient ID: Adrian Peterson MRN: [id number]; DOB: 02-17-1960  Admit date: 04/19/2020 Date of Consult: 04/20/2020  PCP:  Beryle Lathe, MD   [facility]  Cardiologist:  No primary care provider on file.  Advanced Practice Provider:  No care team member to display Electrophysiologist:  None   Patient Profile:   Adrian Peterson is a 60 y.o. male with a hx of deceased donor kidney transplant 02/06/2009 at [facility] as well as hypertension who is being seen today for the evaluation of SVT and troponin elevation at the request of Dr. Wilkie Aye.  History of Present Illness:   Mr. Adrian Peterson present to the emergency department this evening after an hour of chest discomfort, lightheadedness, and generally feeling unwell.  He was found to be in rapid SVT with heart rate approximately 200.  He received 6 mg of IV adenosine with successful conversion to sinus rhythm.  He had some residual chest discomfort immediately afterward, but is currently chest pain-free and feeling well.  He was noted to have elevated and uptrending troponins.  He reports that he has had at least 3 similar episodes in the past 2 months.  He did not seek medical care for those episodes because they lasted a shorter duration than the one that brought him into the emergency department this evening.  He has been told that he has high blood pressure, but otherwise he has no known cardiac history.  No recent viral symptoms or illness.   Medical History  1. s/p deceased donor kidney transplant 02/06/2005. congenital obstructive uropathy. alemtuzumab induction. baseline creatinine 1 mg/dl.  2. chr bladder outlet obstruction. performs i+o bladder cath 4x daily.  3. hypertension  4. secondary/tertiary hyperparathyroidism   Surgical history:  Deceased donor kidney transplant 02/06/2005  Home Medications:  Prior to Admission medications   Medication Sig Start Date End Date  Taking? Authorizing Provider  mycophenolate (CELLCEPT) 500 MG tablet Take 500 mg by mouth 2 (two) times daily.   Yes [provider]  tacrolimus (PROGRAF) 1 MG capsule Take 2 mg by mouth 2 (two) times daily.   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  He has never smoked.  His wife is here at bedside with him. Social History   Socioeconomic History  . Marital status: Married    Spouse name: Not on file  . Number of children: Not on file  . Years of education: Not on file  . Highest education level: Not on file  Occupational History  . Not on file  Tobacco Use  . Smoking status: Never Smoker  . Smokeless tobacco: Not on file  Substance and Sexual Activity  . Alcohol use: Not on file  . Drug use: Not on file  . Sexual activity: Not on file  Other Topics Concern  . Not on file  Social History Narrative  . Not on file   Social Determinants of Health   Financial Resource Strain: Not on file  Food Insecurity: Not on file  Transportation Needs: Not on file  Physical Activity: Not on file  Stress: Not on file  Social Connections: Not on file  Intimate Partner Violence: Not on file    Family History:   No known family history of heart disease.  No known family history of abnormal heart rhythms.  ROS:  Please see the history of present illness.  All other  ROS reviewed and negative.     Physical Exam/Data:   Vitals:   04/19/20 2045 04/19/20 2215 04/19/20 2230 04/19/20 2330  BP: 134/71 127/63 133/66 125/75  Pulse: 78 71 79 73  Resp: 18  (!) 25 (!) 22  Temp:      TempSrc:      SpO2: 97% 97% 97% 98%   No intake or output data in the 24 hours ending 04/20/20 0122 Last 3 Weights 11/08/2015 04/20/2015  Weight (lbs) 169 lb 168 lb  Weight (kg) 76.658 kg 76.204 kg     General:  Well nourished, well developed, in no acute distress HEENT: normal Lymph: no adenopathy Neck:  no JVD Endocrine:  No thryomegaly Vascular: No carotid bruits; FA pulses 2+ bilaterally without bruits  Cardiac:  normal S1, S2; RRR; no murmur  Lungs:  clear to auscultation bilaterally, no wheezing, rhonchi or rales  Abd: soft, nontender, no hepatomegaly  Ext: no edema Musculoskeletal:  No deformities, BUE and BLE strength normal and equal Skin: warm and dry  Neuro:  CNs 2-12 intact, no focal abnormalities noted Psych:  Normal affect   EKG:  The EKG was personally reviewed and demonstrates: narrow complex tachycardia at a rate of 210 bpm with the appearance of retrograde p waves visible in multiple leads. ECG after adenosine demonstrates sinus tachycardia with baseline noise and PVC.   Telemetry:  Telemetry was personally reviewed and demonstrates:  Sinus tachycardia and sinus rhythm.   Relevant CV Studies: none  Laboratory Data:  High Sensitivity Troponin:   Recent Labs  Lab 04/19/20 1657 04/19/20 1857 04/19/20 2206  TROPONINIHS 12 127* 252*     Chemistry Recent Labs  Lab 04/19/20 1657  NA 136  K 4.2  CL 110  CO2 19*  GLUCOSE 162*  BUN 25*  CREATININE 1.22  CALCIUM 10.7*  GFRNONAA >60  ANIONGAP 7    Hematology Recent Labs  Lab 04/19/20 1657  WBC 12.2*  RBC 5.24  HGB 15.1  HCT 46.2  MCV 88.2  MCH 28.8  MCHC 32.7  RDW 13.2  PLT 241    Radiology/Studies:  DG Chest 2 View  Result Date: 04/19/2020 CLINICAL DATA:  Chest pain EXAM: CHEST - 2 VIEW COMPARISON:  11/17/2006 FINDINGS: Heart is upper limits normal in size. No confluent airspace opacities or effusions. No edema. No acute bony abnormality. IMPRESSION: No active cardiopulmonary disease. Electronically Signed   By: Charlett Nose M.D.   On: 04/19/2020 17:38    Assessment and Plan:  60 year old gentleman with distant history of kidney transplant presenting with symptomatic SVT now status post successful cardioversion to sinus rhythm with adenosine.  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   His chest discomfort is most likely related to rapid rates with some demand ischemia; similarly, his troponin elevation is most likely due to demand ischemia.  However, he has no recent cardiac work-up that I can find.  He has no ongoing chest pain and no findings consistent with heart failure.  Therefore, I would like to observe him overnight while trending out his troponins until they peak.  I would like to get a transthoracic echo in the morning to rule out the presence of structural heart disease as a trigger for SVT with crescendo frequency and duration.  We will also start him on beta-blockade.  If the above evaluation is normal, we will discharge him to home with cardiology follow-up to discuss long-term management of SVT.  1. SVT - Start metoprolol tartrate 12.5  mg twice daily  - Transthoracic echo - Trend troponin  - Outpatient cardiology follow up  2. Status post kidney transplant - Continue home mycophenolate 500 mg BID and tacrolimus 2 mg BID   For questions or updates, please contact [facility] Please consult [email or web address] for contact info under    Signed, Cynda Acres, MD  04/20/2020 1:22 AM

## 2020-04-20 NOTE — Progress Notes (Signed)
Progress Note  Patient Name: Adrian Peterson Date of Encounter: 04/20/2020  [facility] Cardiologist: No primary care provider on file.  Subjective   In house interpreter Ashby Dawes present for interview.  Has had no further events, tolerating metoprolol. Currently heart rates in the 60s in sinus rhythm. Feeling well. Has been told that his insurance does not cover doctors in [place], has been seen in [facility] and [employer].   We discussed what to watch for and follow up. Awaiting echo.  Inpatient Medications    Scheduled Meds: . metoprolol tartrate  12.5 mg Oral BID  . mycophenolate  500 mg Oral BID  . sodium chloride flush  3 mL Intravenous Q12H  . tacrolimus  2 mg Oral BID   Continuous Infusions:  PRN Meds:    Vital Signs    Vitals:   04/20/20 0735 04/20/20 0800 04/20/20 0900 04/20/20 1200  BP:  135/71 136/80 119/63  Pulse:  71 61 65  Resp:  20 16 20   Temp: 97.7 F (36.5 C)     TempSrc:      SpO2:  97% 99% 98%   No intake or output data in the 24 hours ending 04/20/20 1232 Last 3 Weights 11/08/2015 04/20/2015  Weight (lbs) 169 lb 168 lb  Weight (kg) 76.658 kg 76.204 kg      Telemetry    Now in NSR - Personally Reviewed  ECG    3/23 initial SVT at 211 bpm, this AM NSR - Personally Reviewed  Physical Exam   GEN: No acute distress.   Neck: No JVD Cardiac: RRR, no murmurs, rubs, or gallops.  Respiratory: Clear to auscultation bilaterally. GI: Soft, nontender, non-distended  MS: No edema; No deformity. Neuro:  Nonfocal  Psych: Normal affect   Labs    High Sensitivity Troponin:   Recent Labs  Lab 04/19/20 1657 04/19/20 1857 04/19/20 2206 04/20/20 0736  TROPONINIHS 12 127* 252* 107*      Chemistry Recent Labs  Lab 04/19/20 1657 04/20/20 0503  NA 136 136  K 4.2 4.7  CL 110 114*  CO2 19* 19*  GLUCOSE 162* 104*  BUN 25* 24*  CREATININE 1.22 1.20  CALCIUM 10.7* 9.8  GFRNONAA >60 >60  ANIONGAP 7 3*      Hematology Recent Labs  Lab 04/19/20 1657 04/20/20 0503  WBC 12.2* 7.3  RBC 5.24 4.77  HGB 15.1 13.7  HCT 46.2 42.9  MCV 88.2 89.9  MCH 28.8 28.7  MCHC 32.7 31.9  RDW 13.2 13.4  PLT 241 219    BNPNo results for input(s): BNP, PROBNP in the last 168 hours.   DDimer No results for input(s): DDIMER in the last 168 hours.   Radiology    DG Chest 2 View  Result Date: 04/19/2020 CLINICAL DATA:  Chest pain EXAM: CHEST - 2 VIEW COMPARISON:  11/17/2006 FINDINGS: Heart is upper limits normal in size. No confluent airspace opacities or effusions. No edema. No acute bony abnormality. IMPRESSION: No active cardiopulmonary disease. Electronically Signed   By: 11/19/2006 M.D.   On: 04/19/2020 17:38    Cardiac Studies   Echo pending  Patient Profile     60 y.o. male with PMH renal transplant followed by [facility] who presented with rapid SVT 04/19/20  Assessment & Plan    Paroxysmal SVT -converted with adenosine -tolerating metoprolol -echo pending -per patient, his insurance does not cover [facility] providers. Asked if he would rather follow with [facility] or [facility]; he will contact [facility] at  [place] for cardiology care.  We are happy to assist with any questions in the interim until he can establish cardiology care. Will plan to discharge after echo.  For questions or updates, please contact [facility] Please consult [email or web address] for contact info under     Signed, Jodelle Red, MD  04/20/2020, 12:32 PM

## 2020-04-20 NOTE — ED Notes (Signed)
Pt ambulated to restroom without difficulty

## 2020-04-20 NOTE — Discharge Summary (Signed)
Discharge Summary    Patient ID: Adrian Peterson MRN: [id number]; DOB: 06/28/1960  Admit date: 04/19/2020 Discharge date: 04/20/2020  PCP:  Beryle Lathe, MD   [facility]  Cardiologist:  [facility]  Discharge Diagnoses    Active Problems:   H/O kidney transplant   Secondary hyperparathyroidism [facility])   SVT (supraventricular tachycardia) (HCC)  Diagnostic Studies/Procedures    Echocardiogram 04/20/20: Pending result however preliminarily ready by Dr. Cristal Deer who states LVEF is approximately 55%.   History of Present Illness     Adrian Peterson is a 60 y.o. male with of deceased donor kidney transplant 02/06/2009 at [facility] as well as hypertensionwho is being seen today for the evaluation of SVT and troponin elevation.  Hospital Course   [facility]Callanan[facility]Ram[facility] after an hour of chest discomfort, lightheadedness, and generally feeling unwell. He was found to be in rapid SVT with heart rate approximately 200. He received 6 mg of IV adenosine with successful conversion to sinus rhythm. He had some residual chest discomfort immediately afterward, but is currently chest pain-free and feeling well. He was noted to have elevated and uptrending troponins.  He reported that he has had at least three similar episodes in the past 2 months.He did not seek medical care for those episodes because they lasted a shorter duration than the one that brought him into the emergency department this evening. He has been told that he has high blood pressure, but otherwise he has no known cardiac history.No recent viral symptoms or illness.  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 His chest discomfort was most likely related to rapid rates with some demand ischemiahowever, he has no recent cardiac work-up that I can find.He had no ongoing chest pain and no findings consistent with heart failure. He was kept overnight for  observation. HsT peaked at 252 then quickly down trended. Echocardiogram was performed that showed a preliminary reading with LVEF at approximately 55% with no other abnormalities.   Due to insurance coverage, he prefers to follow with [facility]. He will call and schedule follow up after discharge.   Consultants: The patient was seen and examined by Dr. Cristal Deer who feels that he is stable and ready for discharge today, 04/20/20  Did the patient have an acute coronary syndrome (MI, NSTEMI, STEMI, etc) this admission?:  No                            Did the patient have a percutaneous coronary intervention (stent / angioplasty)?:  No.    _____________  Discharge Vitals Blood pressure 109/62, pulse (!) 59, temperature 97.7 F (36.5 C), resp. rate 18, SpO2 98 %.  There were no vitals filed for this visit.  Labs & Radiologic Studies    CBC Recent Labs    04/19/20 1657 04/20/20 0503  WBC 12.2* 7.3  HGB 15.1 13.7  HCT 46.2 42.9  MCV 88.2 89.9  PLT 241 219   Basic Metabolic Panel Recent Labs    40/98/11 1657 04/19/20 1712 04/20/20 0503  NA 136  --  136  K 4.2  --  4.7  CL 110  --  114*  CO2 19*  --  19*  GLUCOSE 162*  --  104*  BUN 25*  --  24*  CREATININE 1.22  --  1.20  CALCIUM 10.7*  --  9.8  MG  --  1.9  --  Liver Function Tests No results for input(s): AST, ALT, ALKPHOS, BILITOT, PROT, ALBUMIN in the last 72 hours. No results for input(s): LIPASE, AMYLASE in the last 72 hours. High Sensitivity Troponin:   Recent Labs  Lab 04/19/20 1657 04/19/20 1857 04/19/20 2206 04/20/20 0736  TROPONINIHS 12 127* 252* 107*    BNP Invalid input(s): POCBNP D-Dimer No results for input(s): DDIMER in the last 72 hours. Hemoglobin A1C No results for input(s): HGBA1C in the last 72 hours. Fasting Lipid Panel No results for input(s): CHOL, HDL, LDLCALC, TRIG, CHOLHDL, LDLDIRECT in the last 72 hours. Thyroid Function Tests Recent Labs    04/19/20 1712  TSH 1.854    _____________  DG Chest 2 View  Result Date: 04/19/2020 CLINICAL DATA:  Chest pain EXAM: CHEST - 2 VIEW COMPARISON:  11/17/2006 FINDINGS: Heart is upper limits normal in size. No confluent airspace opacities or effusions. No edema. No acute bony abnormality. IMPRESSION: No active cardiopulmonary disease. Electronically Signed   By: Charlett Nose M.D.   On: 04/19/2020 17:38   Disposition   Pt is being discharged home today in good condition.  Follow-up Plans & Appointments   To call for appointment with [facility]. Number provided to patient in discharge instructions.   Discharge Instructions    Call MD for:  difficulty breathing, headache or visual disturbances   Complete by: As directed    Call MD for:  extreme fatigue   Complete by: As directed    Call MD for:  hives   Complete by: As directed    Call MD for:  persistant dizziness or light-headedness   Complete by: As directed    Call MD for:  persistant nausea and vomiting   Complete by: As directed    Call MD for:  redness, tenderness, or signs of infection (pain, swelling, redness, odor or green/yellow discharge around incision site)   Complete by: As directed    Call MD for:  severe uncontrolled pain   Complete by: As directed    Call MD for:  temperature >100.4   Complete by: As directed    Diet - low sodium heart healthy   Complete by: As directed    Discharge instructions   Complete by: As directed    Please call [phone] for an appointment with [facility]   Increase activity slowly   Complete by: As directed       Discharge Medications   Allergies as of 04/20/2020   No Known Allergies     Medication List    TAKE these medications   metoprolol tartrate 25 MG tablet Commonly known as: LOPRESSOR Take 0.5 tablets (12.5 mg total) by mouth 2 (two) times daily.   mycophenolate 500 MG tablet Commonly known as: CELLCEPT Take 500 mg by mouth 2  (two) times daily.   tacrolimus 1 MG capsule Commonly known as: PROGRAF Take 2 mg by mouth 2 (two) times daily.        Outstanding Labs/Studies   None   Duration of Discharge Encounter   Greater than 30 minutes including physician time.  Signed, Georgie Chard, NP 04/20/2020, 3:10 PM

## 2020-06-15 DIAGNOSIS — R809 Proteinuria, unspecified: Secondary | ICD-10-CM | POA: Insufficient documentation

## 2021-11-20 ENCOUNTER — Encounter (HOSPITAL_COMMUNITY): Payer: Self-pay

## 2021-11-20 ENCOUNTER — Emergency Department (HOSPITAL_COMMUNITY): Payer: Commercial Managed Care - HMO

## 2021-11-20 ENCOUNTER — Other Ambulatory Visit: Payer: Self-pay

## 2021-11-20 ENCOUNTER — Emergency Department (HOSPITAL_COMMUNITY)
Admission: EM | Admit: 2021-11-20 | Discharge: 2021-11-20 | Disposition: A | Discharge status: Home / Self Care | Payer: Commercial Managed Care - HMO | Attending: Emergency Medicine | Admitting: Emergency Medicine

## 2021-11-20 DIAGNOSIS — I471 Supraventricular tachycardia, unspecified: Secondary | ICD-10-CM | POA: Diagnosis not present

## 2021-11-20 DIAGNOSIS — Z79899 Other long term (current) drug therapy: Secondary | ICD-10-CM | POA: Diagnosis not present

## 2021-11-20 DIAGNOSIS — R0789 Other chest pain: Secondary | ICD-10-CM | POA: Diagnosis present

## 2021-11-20 DIAGNOSIS — R799 Abnormal finding of blood chemistry, unspecified: Secondary | ICD-10-CM | POA: Diagnosis not present

## 2021-11-20 DIAGNOSIS — D72829 Elevated white blood cell count, unspecified: Secondary | ICD-10-CM | POA: Diagnosis not present

## 2021-11-20 DIAGNOSIS — R002 Palpitations: Secondary | ICD-10-CM | POA: Diagnosis not present

## 2021-11-20 DIAGNOSIS — R739 Hyperglycemia, unspecified: Secondary | ICD-10-CM | POA: Diagnosis not present

## 2021-11-20 DIAGNOSIS — R7989 Other specified abnormal findings of blood chemistry: Secondary | ICD-10-CM

## 2021-11-20 LAB — CBC
HCT: 43.4 % (ref 39.0–52.0)
Hemoglobin: 14.5 g/dL (ref 13.0–17.0)
MCH: 28.9 pg (ref 26.0–34.0)
MCHC: 33.4 g/dL (ref 30.0–36.0)
MCV: 86.6 fL (ref 80.0–100.0)
Platelets: 200 10*3/uL (ref 150–400)
RBC: 5.01 MIL/uL (ref 4.22–5.81)
RDW: 13.6 % (ref 11.5–15.5)
WBC: 13 10*3/uL — ABNORMAL HIGH (ref 4.0–10.5)
nRBC: 0 % (ref 0.0–0.2)

## 2021-11-20 LAB — BASIC METABOLIC PANEL
Anion gap: 9 (ref 5–15)
BUN: 24 mg/dL — ABNORMAL HIGH (ref 8–23)
CO2: 21 mmol/L — ABNORMAL LOW (ref 22–32)
Calcium: 10.7 mg/dL — ABNORMAL HIGH (ref 8.9–10.3)
Chloride: 107 mmol/L (ref 98–111)
Creatinine, Ser: 1.17 mg/dL (ref 0.61–1.24)
GFR, Estimated: >60 mL/min (ref >60)
Glucose, Bld: 157 mg/dL — ABNORMAL HIGH (ref 70–99)
Potassium: 4.1 mmol/L (ref 3.5–5.1)
Sodium: 137 mmol/L (ref 135–145)

## 2021-11-20 LAB — TROPONIN I (HIGH SENSITIVITY)
Troponin I (High Sensitivity): 13 ng/L (ref <18)
Troponin I (High Sensitivity): 161 ng/L (ref <18)

## 2021-11-20 LAB — T4, FREE: Free T4: 1.28 ng/dL — ABNORMAL HIGH (ref 0.61–1.12)

## 2021-11-20 LAB — MAGNESIUM: Magnesium: 1.7 mg/dL (ref 1.7–2.4)

## 2021-11-20 LAB — TSH: TSH: 1.651 u[IU]/mL (ref 0.350–4.500)

## 2021-11-20 MED ORDER — ADENOSINE 6 MG/2ML IV SOLN
INTRAVENOUS | Status: AC
  Filled 2021-11-20: qty 2

## 2021-11-20 MED ORDER — SODIUM CHLORIDE 0.9 % IV BOLUS
1000.0000 mL | Freq: Once | INTRAVENOUS | Status: AC
  Administered 2021-11-20: 1000 mL via INTRAVENOUS

## 2021-11-20 MED ORDER — METOPROLOL TARTRATE 25 MG PO TABS: 12.5000 mg | ORAL_TABLET | Freq: Two times a day (BID) | ORAL | 3 refills | Status: DC

## 2021-11-20 NOTE — ED Notes (Signed)
Patient awake and alert, provider present at bedside, Adenosine administered at 1508 to right [facility].  Family present at bedside, patient speaking to provider, states " I feel better".

## 2021-11-20 NOTE — ED Provider Notes (Signed)
Patient was initially seen by Dr. Armandina Gemma.  Please see his note.  Patient presented with complaints of chest pain in the setting of supraventricular tachycardia.  Patient's second troponin is elevated.  Patient however remains pain-free and feels well after resolution of his tachycardia.  I discussed the case with Dr. Humphrey Rolls cardiology.  Patient is comfortable going home.  He feels it is appropriate for close outpatient follow-up with EP.  Patient has had elevated troponins previously with a similar event.  Evaluation and diagnostic testing in the emergency department does not suggest an emergent condition requiring admission or immediate intervention beyond what has been performed at this time.  The patient is safe for discharge and has been instructed to return immediately for worsening symptoms, change in symptoms or any other concerns.   Dorie Rank, MD 11/20/21 2026

## 2021-11-20 NOTE — ED Provider Notes (Addendum)
[place] EMERGENCY DEPARTMENT Provider Note   CSN: [id number] Arrival date & time: 11/20/21  1439     History  No chief complaint on file.   Adrian Peterson is a 61 y.o. male.  HPI   61 year old male with medical history significant for renal transplant on CellCept and Prograf who presents to the emergency department with palpitations and chest pain.  The patient states that symptoms came on a few hours prior to arrival.  He endorsed palpitations, left-sided chest pain that was sharp, severe, ripping and tearing in sensation and radiating to his back.  He denied any shortness of breath.  He denied a cough, fevers or chills.  In triage, he was found to be tachycardic with heart rates in the 200s.  His blood pressure was trending down and he was emergently bedded.  Patient was placed on a cardiac monitor he was found to be in SVT, this was confirmed on EKG.  Initially normotensive on arrival.  Vagal maneuvers were attempted x2 and were unsuccessful.  The patient was then administered 6 mg of IV adenosine and had subsequent conversion to normal sinus rhythm.  This was confirmed on EKG.  He states on repeat assessment that his chest pain has since resolved.  Home Medications Prior to Admission medications   Medication Sig Start Date End Date Taking? Authorizing Provider  metoprolol tartrate (LOPRESSOR) 25 MG tablet Take 0.5 tablets (12.5 mg total) by mouth 2 (two) times daily. 04/20/20   Kathyrn Drown D, NP  mycophenolate (CELLCEPT) 500 MG tablet Take 500 mg by mouth 2 (two) times daily.    [provider]  tacrolimus (PROGRAF) 1 MG capsule Take 2 mg by mouth 2 (two) times daily.    [provider]      Allergies    Patient has no known allergies.    Review of Systems   Review of Systems  Unable to perform ROS: Acuity of condition    Physical Exam Updated Vital Signs BP (!) 150/81 (BP Location: Right Arm)   Pulse 92   Temp 97.9 F (36.6 C)  (Oral)   Resp 11   Ht 5\' 2"  (1.575 m)   Wt 81.6 kg   SpO2 99%   BMI 32.92 kg/m  Physical Exam Vitals and nursing note reviewed.  Constitutional:      General: He is in acute distress.     Appearance: He is well-developed. He is ill-appearing and diaphoretic.  HENT:     Head: Normocephalic and atraumatic.  Eyes:     Conjunctiva/sclera: Conjunctivae normal.  Cardiovascular:     Rate and Rhythm: Regular rhythm. Tachycardia present.     Pulses: Normal pulses.  Pulmonary:     Effort: Pulmonary effort is normal. No respiratory distress.     Breath sounds: Normal breath sounds.  Abdominal:     Palpations: Abdomen is soft.     Tenderness: There is no abdominal tenderness.  Musculoskeletal:        General: No swelling.     Cervical back: Neck supple.  Skin:    General: Skin is warm.     Capillary Refill: Capillary refill takes less than 2 seconds.  Neurological:     Mental Status: He is alert.  Psychiatric:        Mood and Affect: Mood normal.     ED Results / Procedures / Treatments   Labs (all labs ordered are listed, but only abnormal results are displayed) Labs Reviewed  BASIC METABOLIC  PANEL - Abnormal; Notable for the following components:      Result Value   CO2 21 (*)    Glucose, Bld 157 (*)    BUN 24 (*)    Calcium 10.7 (*)    All other components within normal limits  CBC - Abnormal; Notable for the following components:   WBC 13.0 (*)    All other components within normal limits  TSH  T4, FREE  MAGNESIUM  TROPONIN I (HIGH SENSITIVITY)    EKG EKG Interpretation  Date/Time:  Tuesday November 20 2021 15:10:41 EDT Ventricular Rate:  95 PR Interval:  149 QRS Duration: 102 QT Interval:  332 QTC Calculation: 418 R Axis:   100 Text Interpretation: Sinus rhythm Atrial premature complex Right axis deviation Low voltage, precordial leads Abnormal R-wave progression, early transition Abnormal inferior Q waves Confirmed by Regan Lemming ([id number]) on 11/20/2021  3:20:45 PM  Radiology DG Chest Portable 1 View  Result Date: 11/20/2021 CLINICAL DATA:  palpitations, SVT EXAM: PORTABLE CHEST 1 VIEW COMPARISON:  Chest x-ray November 17, 2006. FINDINGS: The heart size and mediastinal contours are within normal limits. Both lungs are clear. No visible pleural effusions or pneumothorax. No acute osseous abnormality. IMPRESSION: No active disease. Electronically Signed   By: Margaretha Sheffield M.D.   On: 11/20/2021 15:33    Procedures Procedures    Medications Ordered in ED Medications  adenosine (ADENOCARD) 6 MG/2ML injection (  Given 11/20/21 1508)    ED Course/ Medical Decision Making/ A&P                           Medical Decision Making Amount and/or Complexity of Data Reviewed Labs: ordered. Radiology: ordered.    61 year old male with medical history significant for renal transplant on CellCept and Prograf who presents to the emergency department with palpitations and chest pain.  The patient states that symptoms came on a few hours prior to arrival.  He endorsed palpitations, left-sided chest pain that was sharp, severe, ripping and tearing in sensation and radiating to his back.  He denied any shortness of breath.  He denied a cough, fevers or chills.  In triage, he was found to be tachycardic with heart rates in the 200s.  His blood pressure was trending down and he was emergently bedded.  Patient was placed on a cardiac monitor he was found to be in SVT, this was confirmed on EKG.  Initially normotensive on arrival.  Vagal maneuvers were attempted x2 and were unsuccessful.  The patient was then administered 6 mg of IV adenosine and had subsequent conversion to normal sinus rhythm.  This was confirmed on EKG.  He states on repeat assessment that his chest pain has since resolved.  Work-up initiated to include a chest x-ray which revealed no active disease, CBC with a leukocytosis to 13, no anemia, BMP with mild hypercalcemia to 10.7, mildly  elevated BUN to 24, creatinine normal at 1.17, hyperglycemia 157, not anion gap acidosis with a gap of 21 and a anion gap of 9, normal sodium and potassium, initial troponin 13, repeat pending, TSH, free T4, magnesium all collected and pending.  On further repeat assessment, the patient continued to remain asymptomatic and chest pain-free.  I considered aortic dissection versus ACS, however in the setting of a known cardiac arrhythmia and chest discomfort, lower suspicion at this time.  The patient is currently asymptomatic, chest pain-free, intact distal pulses that are symmetric bilaterally.  Suspect likely  chest pain in the setting of elevated heart rate,  plan to follow-up delta troponin, reassess the patient following remainder of laboratory testing.  Signout given to Dr. Tomi Bamberger at [phone].   Final Clinical Impression(s) / ED Diagnoses Final diagnoses:  SVT (supraventricular tachycardia)    Rx / DC Orders ED Discharge Orders     None         Regan Lemming, MD 11/20/21 1554    Regan Lemming, MD 11/20/21 1555

## 2021-11-20 NOTE — Discharge Instructions (Addendum)
Continue your metoprolol medication.  Follow-up with the cardiologist for further evaluation.  The office should call you in the next 24 to 48 hours.

## 2021-11-20 NOTE — ED Notes (Signed)
Patient resting in bed with eyes closed, no s/s of distress, family present at bedside, will continue to monitor.

## 2021-11-20 NOTE — ED Triage Notes (Signed)
Patient states he was working and started having chest pain and could feel his heart racing for the last couple of hours before coming in.

## 2021-11-21 NOTE — Progress Notes (Unsigned)
Cardiology Office Note:    Date:  11/22/2021   ID:  Adrian Peterson, DOB 1960/10/23, MRN [id number]  PCP:  Farrel Conners, MD   [facility] Prov[facility] Cardiologist:  Lenna Sciara, MD Referring MD: Dorie Rank, MD   Chief Complaint/Reason for Referral: Emergency room follow-up for SVT  ASSESSMENT:    1. SVT (supraventricular tachycardia)   2. H/O kidney transplant   3. Primary hypertension   4. Precordial pain     PLAN:    In order of problems listed above: 1.  SVT: I will stop the patient's 12.5 mg of Lopressor twice daily and start Toprol-XL 37.5 mg at bedtime.  I did speak to the patient about perhaps being evaluated by EP for an elective SVT ablation.  He would like to pursue medical therapy at this point in time.  Certainly if he were to be admitted with recurrent SVT then I think an SVT ablation would be indicated at that time. 2.  History of kidney transplantation: His kidney function is normal. 3.  Hypertension: His blood pressure is well controlled on his current regimen. 4.  Chest pain: Given his elevated troponin we will obtain a coronary CTA to evaluate further.  If the patient has mild obstructive coronary artery disease, they will require a statin (with goal LDL < 70) and aspirin, if they have high-grade disease we will need to consider optimal medical therapy and if symptoms are refractory to medical therapy, then a cardiac catheterization with possible PCI will be pursued to alleviate symptoms.  If they have high risk disease we will proceed directly to cardiac catheterization.               Dispo:  Return in about 6 months (around 05/24/2022).      Medication Adjustments/Labs and Tests Ordered: Current medicines are reviewed at length with the patient today.  Concerns regarding medicines are outlined above.  The following changes have been made:     Labs/tests ordered: Orders Placed This Encounter  Procedures   CT CORONARY MORPH W/CTA COR W/SCORE  W/CA W/CM &/OR WO/CM   EKG 12-Lead    Medication Changes: Meds ordered this encounter  Medications   metoprolol succinate (TOPROL XL) 25 MG 24 hr tablet    Sig: Take 1.5 tablets (37.5 mg total) by mouth at bedtime.    Dispense:  135 tablet    Refill:  3    Changed from lopressor/dose change     Current medicines are reviewed at length with the patient today.  The patient does not have concerns regarding medicines.   History of Present Illness:    FOCUSED PROBLEM LIST:   1.  History of renal transplant 2011 on immunosuppression 2.  SVT 3.  Hypertension  The patient is a 61 y.o. male with the indicated medical history here for emergency room follow-up.  The patient was admitted to the hospital in March 2022 with chest discomfort.  He was found to be in a supraventricular tachycardia at that time and received 6 mg of IV adenosine with conversion to sinus rhythm.  His troponins were mildly elevated.  He was started on metoprolol and echocardiogram demonstrated preserved ejection fraction with no significant structural valvular issues.  Patient was to have followed up with outpatient cardiology but apparently this did not occur.  He presented yesterday and recurrent SVT.  He was hemodynamically unstable.  Vagal maneuvers were attempted and ultimately adenosine was administered with successful conversion to normal sinus rhythm.  He was  chest pain-free after normal sinus rhythm was restored.  The patient is here with his wife.  He tells me he has not developed any exertional anginal pains at home when he is in normal sinus rhythm.  He denies any significant shortness of breath.  Denies any presyncope or syncope.  He has had no significant palpitations.  He is otherwise well without significant complaints.          Current Medications: Current Meds  Medication Sig   metoprolol succinate (TOPROL XL) 25 MG 24 hr tablet Take 1.5 tablets (37.5 mg total) by mouth at bedtime.   mycophenolate  (CELLCEPT) 500 MG tablet Take 500 mg by mouth 2 (two) times daily.   tacrolimus (PROGRAF) 1 MG capsule Take 2 mg by mouth 2 (two) times daily.   [DISCONTINUED] lisinopril (ZESTRIL) 5 MG tablet Take 1 tablet by mouth daily.   [DISCONTINUED] metoprolol tartrate (LOPRESSOR) 25 MG tablet Take 0.5 tablets (12.5 mg total) by mouth 2 (two) times daily.   [DISCONTINUED] mycophenolate (CELLCEPT) 500 MG tablet Take 1 tablet by mouth in the morning and at bedtime.   [DISCONTINUED] tacrolimus (PROGRAF) 1 MG capsule Take 2 capsules by mouth in the morning and at bedtime.     Allergies:    Patient has no known allergies.   Social History:   Social History   Tobacco Use   Smoking status: Never   Smokeless tobacco: Never  Substance Use Topics   Alcohol use: Not Currently   Drug use: Never     Family Hx: History reviewed. No pertinent family history.   Review of Systems:   Please see the history of present illness.    All other systems reviewed and are negative.     EKGs/Labs/Other Test Reviewed:    EKG:  EKG performed October 2023 that I personally reviewed demonstrates sinus rhythm with isolated Q-wave in lead III; EKG performed today that I personally reviewed demonstrates sinus rhythm without preexcitation.  Prior CV studies:  TTE 2022:  1. Left ventricular ejection fraction, by estimation, is 55 to 60%. The  left ventricle has normal function. The left ventricle has no regional  wall motion abnormalities. There is mild concentric left ventricular  hypertrophy. Left ventricular diastolic  parameters are consistent with Grade I diastolic dysfunction (impaired  relaxation). Elevated left atrial pressure.   2. Right ventricular systolic function is normal. The right ventricular  size is normal. There is normal pulmonary artery systolic pressure.   3. The mitral valve is normal in structure. Mild mitral valve  regurgitation. No evidence of mitral stenosis.   4. The aortic valve is  normal in structure. There is mild calcification  of the aortic valve. There is mild thickening of the aortic valve. Aortic  valve regurgitation is not visualized. Mild aortic valve stenosis. Aortic  valve mean gradient measures 9.0  mmHg.   5. The inferior vena cava is normal in size with greater than 50%  respiratory variability, suggesting right atrial pressure of 3 mmHg.   Other studies Reviewed: Review of the additional studies/records demonstrates: No imaging available demonstrating aortic atherosclerosis or coronary artery calcification.  Recent Labs: 11/20/2021: BUN 24; Creatinine, Ser 1.17; Hemoglobin 14.5; Magnesium 1.7; Platelets 200; Potassium 4.1; Sodium 137; TSH 1.651   Recent Lipid Panel No results found for: "CHOL", "TRIG", "HDL", "LDLCALC", "LDLDIRECT"  Risk Assessment/Calculations:                Physical Exam:    VS:  BP 130/62 (BP Location:  Left Arm, Patient Position: Sitting)   Pulse 68   Ht 5\' 3"  (1.6 m)   SpO2 99%   BMI 31.89 kg/m    Wt Readings from Last 3 Encounters:  11/20/21 180 lb (81.6 kg)  11/08/15 169 lb (76.7 kg)  04/20/15 168 lb (76.2 kg)    GENERAL:  No apparent distress, AOx3 HEENT:  No carotid bruits, +2 carotid impulses, no scleral icterus CAR: RRR no murmurs, gallops, rubs, or thrills RES:  Clear to auscultation bilaterally ABD:  Soft, nontender, nondistended, positive bowel sounds x 4 VASC:  +2 radial pulses, +2 carotid pulses, palpable pedal pulses NEURO:  CN 2-12 grossly intact; motor and sensory grossly intact PSYCH:  No active depression or anxiety EXT:  No edema, ecchymosis, or cyanosis  Signed, Early Osmond, MD  11/22/2021 3:20 PM    [address], [place], [place]  [address] Phone: [phone]; Fax: [phone]   Note:  This document was prepared using Dragon voice recognition software and may include unintentional dictation errors.

## 2021-11-22 ENCOUNTER — Ambulatory Visit: Payer: Commercial Managed Care - HMO | Attending: Internal Medicine | Admitting: Internal Medicine

## 2021-11-22 ENCOUNTER — Encounter: Payer: Self-pay | Admitting: Internal Medicine

## 2021-11-22 VITALS — BP 130/62 | HR 68 | Ht 63.0 in | Wt 165.8 lb

## 2021-11-22 DIAGNOSIS — Z940 Kidney transplant status: Secondary | ICD-10-CM

## 2021-11-22 DIAGNOSIS — I10 Essential (primary) hypertension: Secondary | ICD-10-CM | POA: Diagnosis not present

## 2021-11-22 DIAGNOSIS — R072 Precordial pain: Secondary | ICD-10-CM | POA: Diagnosis not present

## 2021-11-22 DIAGNOSIS — I471 Supraventricular tachycardia, unspecified: Secondary | ICD-10-CM

## 2021-11-22 MED ORDER — METOPROLOL SUCCINATE ER 25 MG PO TB24: 37.5000 mg | ORAL_TABLET | Freq: Every day | ORAL | 3 refills | Status: DC

## 2021-11-22 NOTE — Patient Instructions (Signed)
Medication Instructions:  Your physician has recommended you make the following change in your medication:  1.) stop metoprolol tartrate (Lopressor) 2.) start metoprolol succinate (Toprol XL) 25 mg - take ONE AND HALF TABS (37.5 mg) daily at BEDTIME  *If you need a refill on your cardiac medications before your next appointment, please call your pharmacy*   Lab Work: none   Testing/Procedures: Cardiac CTA - see instructions below   Follow-Up: At [facility], you and your health needs are our priority.  As part of our continuing mission to provide you with exceptional heart care, we have created designated Provider Care Teams.  These Care Teams include your primary Cardiologist (physician) and Advanced Practice Providers (APPs -  Physician Assistants and Nurse Practitioners) who all work together to provide you with the care you need, when you need it.  We recommend signing up for the patient portal called "MyChart".  Sign up information is provided on this After Visit Summary.  MyChart is used to connect with patients for Virtual Visits (Telemedicine).  Patients are able to view lab/test results, encounter notes, upcoming appointments, etc.  Non-urgent messages can be sent to your provider as well.   To learn more about what you can do with MyChart, go to [email or web address].    Your next appointment:   6 month(s)  The format for your next appointment:   In Person  Provider:   Early Osmond, MD     Other Instructions   Your cardiac CT will be scheduled at  [facility] [address], [place] [address] [phone]  Please arrive at the [facility] and Children's Entrance (Entrance C2) of [facility] 30 minutes prior to test start time. You can use the FREE valet parking offered at entrance C (encouraged to control the heart rate for the test)  Proceed to the [facility] Radiology Department (first floor) to check-in and test  prep.  All radiology patients and guests should use entrance C2 at [facility], accessed from [facility], even though the hospital's physical address listed is [address].     Please follow these instructions carefully (unless otherwise directed):  Hold all erectile dysfunction medications at least 3 days (72 hrs) prior to test. (Ie viagra, cialis, sildenafil, tadalafil, etc) We will administer nitroglycerin during this exam.   On the Night Before the Test: Be sure to Drink plenty of water. Do not consume any caffeinated/decaffeinated beverages or chocolate 12 hours prior to your test. Do not take any antihistamines 12 hours prior to your test.  On the Day of the Test: Drink plenty of water until 1 hour prior to the test. Do not eat any food 1 hour prior to test. You may take your regular medications prior to the test.        After the Test: Drink plenty of water. After receiving IV contrast, you may experience a mild flushed feeling. This is normal. On occasion, you may experience a mild rash up to 24 hours after the test. This is not dangerous. If this occurs, you can take Benadryl 25 mg and increase your fluid intake. If you experience trouble breathing, this can be serious. If it is severe call 911 IMMEDIATELY. If it is mild, please call our office. If you take any of these medications: Glipizide/Metformin, Avandament, Glucavance, please do not take 48 hours after completing test unless otherwise instructed.  We will call to schedule your test 2-4 weeks out  understanding that some insurance companies will need an authorization prior to the service being performed.   For non-scheduling related questions, please contact the cardiac imaging nurse navigator should you have any questions/concerns: Rockwell Alexandria, Cardiac Imaging Nurse Navigator Larey Brick, Cardiac Imaging Nurse Navigator [facility] Direct Office Dial:  [phone]   For scheduling needs, including cancellations and rescheduling, please call [place], [phone].

## 2021-12-05 ENCOUNTER — Telehealth (HOSPITAL_COMMUNITY): Payer: Self-pay | Admitting: *Deleted

## 2021-12-05 NOTE — Telephone Encounter (Signed)
Attempted to call patient with a Spanish interpreter Michaell Cowing, [place] # [phone]) regarding his upcoming cardiac CT appointment. Left message on voicemail with name and callback number  Larey Brick RN Navigator Cardiac Imaging [facility] [phone] Office [phone] Cell

## 2021-12-06 ENCOUNTER — Ambulatory Visit (HOSPITAL_COMMUNITY)
Admission: RE | Admit: 2021-12-06 | Discharge: 2021-12-06 | Disposition: A | Discharge status: Home / Self Care | Payer: Commercial Managed Care - HMO | Source: Ambulatory Visit | Attending: Internal Medicine | Admitting: Internal Medicine

## 2021-12-06 ENCOUNTER — Other Ambulatory Visit: Payer: Self-pay | Admitting: Cardiology

## 2021-12-06 ENCOUNTER — Ambulatory Visit (HOSPITAL_BASED_OUTPATIENT_CLINIC_OR_DEPARTMENT_OTHER)
Admission: RE | Admit: 2021-12-06 | Discharge: 2021-12-06 | Disposition: A | Discharge status: Home / Self Care | Payer: Commercial Managed Care - HMO | Source: Ambulatory Visit | Attending: Cardiology | Admitting: Cardiology

## 2021-12-06 DIAGNOSIS — I2510 Atherosclerotic heart disease of native coronary artery without angina pectoris: Secondary | ICD-10-CM | POA: Diagnosis not present

## 2021-12-06 DIAGNOSIS — R931 Abnormal findings on diagnostic imaging of heart and coronary circulation: Secondary | ICD-10-CM

## 2021-12-06 DIAGNOSIS — R072 Precordial pain: Secondary | ICD-10-CM | POA: Diagnosis present

## 2021-12-06 DIAGNOSIS — I700 Atherosclerosis of aorta: Secondary | ICD-10-CM

## 2021-12-06 MED ORDER — NITROGLYCERIN 0.4 MG SL SUBL
0.8000 mg | SUBLINGUAL_TABLET | Freq: Once | SUBLINGUAL | Status: AC
  Administered 2021-12-06: 0.8 mg via SUBLINGUAL

## 2021-12-06 MED ORDER — METOPROLOL TARTRATE 5 MG/5ML IV SOLN
10.0000 mg | INTRAVENOUS | Status: DC | PRN
  Administered 2021-12-06: 10 mg via INTRAVENOUS

## 2021-12-06 MED ORDER — IOHEXOL 350 MG/ML SOLN
95.0000 mL | Freq: Once | INTRAVENOUS | Status: AC | PRN
  Administered 2021-12-06: 95 mL via INTRAVENOUS

## 2021-12-06 MED ORDER — METOPROLOL TARTRATE 5 MG/5ML IV SOLN
INTRAVENOUS | Status: AC
  Filled 2021-12-06: qty 10

## 2021-12-06 MED ORDER — NITROGLYCERIN 0.4 MG SL SUBL
SUBLINGUAL_TABLET | SUBLINGUAL | Status: AC
  Filled 2021-12-06: qty 2

## 2021-12-26 ENCOUNTER — Telehealth: Payer: Self-pay | Admitting: *Deleted

## 2021-12-26 DIAGNOSIS — I2510 Atherosclerotic heart disease of native coronary artery without angina pectoris: Secondary | ICD-10-CM

## 2021-12-26 MED ORDER — ASPIRIN 81 MG PO TBEC: 81.0000 mg | DELAYED_RELEASE_TABLET | Freq: Every day | ORAL | 3 refills | Status: AC

## 2021-12-26 MED ORDER — ATORVASTATIN CALCIUM 20 MG PO TABS: 20.0000 mg | ORAL_TABLET | Freq: Every day | ORAL | 3 refills | Status: DC

## 2021-12-26 NOTE — Telephone Encounter (Signed)
Called patient via [employer] and informed of cta results and recommendations. He voices understanding and will pick up asa and atorvastatin this evening.  Lab appointment scheduled for 02/27/22.

## 2021-12-26 NOTE — Telephone Encounter (Signed)
-----   Message from Orbie Pyo, MD sent at 12/06/2021 12:02 PM EST ----- Let him know the CT scan showed only mild blockages in the heart arteries that only need medications.  Lets sart start aspirin 81 mg daily and atorvastatin 20 mg daily.  Lets get a lipid panel, LFTs, LP(a) in 2 months.

## 2022-02-27 ENCOUNTER — Ambulatory Visit: Payer: 59 | Attending: Internal Medicine

## 2022-02-27 DIAGNOSIS — I2510 Atherosclerotic heart disease of native coronary artery without angina pectoris: Secondary | ICD-10-CM

## 2022-02-28 ENCOUNTER — Telehealth: Payer: Self-pay | Admitting: Internal Medicine

## 2022-02-28 LAB — HEPATIC FUNCTION PANEL
ALT: 10 IU/L (ref 0–44)
AST: 21 IU/L (ref 0–40)
Albumin: 4.2 g/dL (ref 3.9–4.9)
Alkaline Phosphatase: 115 IU/L (ref 44–121)
Bilirubin Total: 0.7 mg/dL (ref 0.0–1.2)
Bilirubin, Direct: 0.19 mg/dL (ref 0.00–0.40)
Total Protein: 6.9 g/dL (ref 6.0–8.5)

## 2022-02-28 LAB — LIPID PANEL
Chol/HDL Ratio: 3.7 ratio (ref 0.0–5.0)
Cholesterol, Total: 103 mg/dL (ref 100–199)
HDL: 28 mg/dL — ABNORMAL LOW (ref >39)
LDL Chol Calc (NIH): 58 mg/dL (ref 0–99)
Triglycerides: 84 mg/dL (ref 0–149)
VLDL Cholesterol Cal: 17 mg/dL (ref 5–40)

## 2022-02-28 NOTE — Telephone Encounter (Signed)
Attempted to contact patient using Spanish interpretor (agent ID [id number]). Unable to reach patient and unable to leave message.  Will forward to primary RN to attempt another time.

## 2022-02-28 NOTE — Telephone Encounter (Signed)
-----  Message from Early Osmond, MD sent at 02/28/2022  7:15 AM EST ----- Let him know his cholesterol is very good and to continue his meds.

## 2022-03-07 ENCOUNTER — Encounter: Payer: Self-pay | Admitting: *Deleted

## 2022-03-07 NOTE — Telephone Encounter (Signed)
Letter of normal results placed in outgoing mail to patient.

## 2022-11-07 NOTE — Progress Notes (Signed)
Cardiology Office Note:   Date:  11/08/2022  ID:  Ancle Morelan, DOB 09/10/1960, MRN [id number] PCP:  Karie Georges, MD  [facility] Providers Cardiologist:  Alverda Skeans, MD Referring MD: Karie Georges, MD  Chief Complaint/Reason for Referral: Cardiology follow-up ASSESSMENT:    1. Coronary artery disease involving native coronary artery of native heart without angina pectoris   2. Hyperlipidemia LDL goal <70   3. SVT (supraventricular tachycardia) (HCC)   4. Primary hypertension   5. H/O kidney transplant   6. Other fatigue     PLAN:   In order of problems listed above: Coronary artery disease: Mild on coronary CTA.  Continue aspirin and atorvastatin 20 mg. Hyperlipidemia: LDL in June was 86; will increase atorvastatin to 40 mg and check lipid panel, LFTs, LP(a) in 2 months. SVT:  No recurrence. Hypertension:  BP is well controlled today. History of kidney transplantation: Being followed by other providers. Fatigue:  Will check CMP, CBC, HbA1c, and reflex TSH.  I spoke to the patient about reconnecting with his PCP.  I provided the phone number for him and he will call their office later today.        Dispo:  Return in about 1 year (around 11/08/2023).      Medication Adjustments/Labs and Tests Ordered: Current medicines are reviewed at length with the patient today.  Concerns regarding medicines are outlined above.  The following changes have been made:     Labs/tests ordered: Orders Placed This Encounter  Procedures   Comprehensive metabolic panel   TSH Rfx on Abnormal to Free T4   CBC   Hemoglobin A1c   Lipoprotein A (LPA)   Lipid panel   Hepatic function panel   EKG 12-Lead    Medication Changes: Meds ordered this encounter  Medications   atorvastatin (LIPITOR) 40 MG tablet    Sig: Take 1 tablet (40 mg total) by mouth daily.    Dispense:  90 tablet    Refill:  3    Dose increase    Current medicines are reviewed at length with the  patient today.  The patient does not have concerns regarding medicines.  History of Present Illness:      FOCUSED PROBLEM LIST:   Renal transplantation 2011 on immunosuppression SVT on Toprol Hypertension Mild coronary artery disease coronary CTA 2023  October 2023:  The patient is a 62 y.o. male with the indicated medical history here for emergency room follow-up.  The patient was admitted to the hospital in March 2022 with chest discomfort.  He was found to be in a supraventricular tachycardia at that time and received 6 mg of IV adenosine with conversion to sinus rhythm.  His troponins were mildly elevated.  He was started on metoprolol and echocardiogram demonstrated preserved ejection fraction with no significant structural valvular issues.  Patient was to have followed up with outpatient cardiology but apparently this did not occur.  He presented yesterday and recurrent SVT.  He was hemodynamically unstable.  Vagal maneuvers were attempted and ultimately adenosine was administered with successful conversion to normal sinus rhythm.  He was chest pain-free after normal sinus rhythm was restored.   The patient is here with his wife.  He tells me he has not developed any exertional anginal pains at home when he is in normal sinus rhythm.  He denies any significant shortness of breath.  Denies any presyncope or syncope.  He has had no significant palpitations.  He is otherwise well without significant  complaints.  Plan: Change Lopressor 12.5 mg twice a day to Toprol-XL 37.5 mg at bedtime; obtain coronary CTA given elevated troponins and chest pain in the context of SVT.  October 2024: In the interim the patient had a coronary CTA which was reassuring with only mild coronary artery disease.  He was started on aspirin and atorvastatin 20 mg.  He is here and with the aid of an interpreter we conducted this interview.  He has had some joint pains which are chronic.  This predated him starting  atorvastatin.  He is not worse after he started atorvastatin.  He is doing fairly well from a cardiovascular standpoint.  He works as Personnel officer and has had no exertional angina or exertional dyspnea.  He occasionally develops muscle aches and joint aches at times.  He does not have a history of gout.  Additionally he tells me near the end of the day after he is working he gets fatigued.  There is he has some good days and bad days.  Additionally in the morning he will sometimes be short of breath but this seems to resolve after some time.  He denies any orthopnea, paroxysmal nocturnal dyspnea, presyncope, or syncope.  He fortunately does not required any emergency room visits or hospitalizations.  He does not snore and denies any daytime somnolence.          Current Medications: Current Meds  Medication Sig   aspirin EC 81 MG tablet Take 1 tablet (81 mg total) by mouth daily. Swallow whole.   atorvastatin (LIPITOR) 40 MG tablet Take 1 tablet (40 mg total) by mouth daily.   losartan (COZAAR) 25 MG tablet Take 25 mg by mouth daily.   metoprolol tartrate (LOPRESSOR) 25 MG tablet 25 mg. Take 1/2 tablet by mouth twice daily   mycophenolate (CELLCEPT) 500 MG tablet Take 500 mg by mouth 2 (two) times daily.   tacrolimus (PROGRAF) 1 MG capsule Take 2 mg by mouth 2 (two) times daily.   [DISCONTINUED] atorvastatin (LIPITOR) 20 MG tablet Take 1 tablet (20 mg total) by mouth daily.     Review of Systems:   Please see the history of present illness.    All other systems reviewed and are negative.     EKGs/Labs/Other Test Reviewed:   EKG:  EKG performed October 2024 that I personally reviewed demonstrates sinus rhythm without preexcitation.   EKG Interpretation Date/Time:    Ventricular Rate:    PR Interval:    QRS Duration:    QT Interval:    QTC Calculation:   R Axis:      Text Interpretation:           Risk Assessment/Calculations:          Physical Exam:   VS:  BP 122/60    Pulse 68   Ht 5\' 2"  (1.575 m)   Wt 163 lb (73.9 kg)   BMI 29.81 kg/m        Wt Readings from Last 3 Encounters:  11/08/22 163 lb (73.9 kg)  11/22/21 165 lb 12.8 oz (75.2 kg)  11/20/21 180 lb (81.6 kg)      GENERAL:  No apparent distress, AOx3 HEENT:  No carotid bruits, +2 carotid impulses, no scleral icterus CAR: RRR no murmurs, gallops, rubs, or thrills RES:  Clear to auscultation bilaterally ABD:  Soft, nontender, nondistended, positive bowel sounds x 4 VASC:  +2 radial pulses, +2 carotid pulses NEURO:  CN 2-12 grossly intact; motor and sensory grossly intact PSYCH:  No active depression or anxiety EXT:  No edema, ecchymosis, or cyanosis  Signed, Orbie Pyo, MD  11/08/2022 9:37 AM    [facility] [address] [place], [place], [place]  [address] Phone: [phone]; Fax: [phone]   Note:  This document was prepared using Dragon voice recognition software and may include unintentional dictation errors.

## 2022-11-08 ENCOUNTER — Ambulatory Visit: Payer: 59 | Attending: Internal Medicine | Admitting: Internal Medicine

## 2022-11-08 ENCOUNTER — Encounter: Payer: Self-pay | Admitting: Internal Medicine

## 2022-11-08 VITALS — BP 122/60 | HR 68 | Ht 62.0 in | Wt 163.0 lb

## 2022-11-08 DIAGNOSIS — I2510 Atherosclerotic heart disease of native coronary artery without angina pectoris: Secondary | ICD-10-CM | POA: Diagnosis not present

## 2022-11-08 DIAGNOSIS — E785 Hyperlipidemia, unspecified: Secondary | ICD-10-CM

## 2022-11-08 DIAGNOSIS — I10 Essential (primary) hypertension: Secondary | ICD-10-CM | POA: Diagnosis not present

## 2022-11-08 DIAGNOSIS — I471 Supraventricular tachycardia, unspecified: Secondary | ICD-10-CM

## 2022-11-08 DIAGNOSIS — R5383 Other fatigue: Secondary | ICD-10-CM

## 2022-11-08 DIAGNOSIS — Z940 Kidney transplant status: Secondary | ICD-10-CM

## 2022-11-08 MED ORDER — ATORVASTATIN CALCIUM 40 MG PO TABS: 40.0000 mg | ORAL_TABLET | Freq: Every day | ORAL | 3 refills | Status: AC

## 2022-11-08 NOTE — Patient Instructions (Signed)
Medication Instructions:  Your physician has recommended you make the following change in your medication:  1.) increase atorvastatin to 40 mg - one tablet daily  *If you need a refill on your cardiac medications before your next appointment, please call your pharmacy*   Lab Work: ~~Today: cbc, cmet, hgA1c, reflex TSH  ~~And then return in 2 months (around Dec 13) for blood work (lipids, liver function, Lp(a)   Testing/Procedures: none   Follow-Up: At [facility], you and your health needs are our priority.  As part of our continuing mission to provide you with exceptional heart care, we have created designated Provider Care Teams.  These Care Teams include your primary Cardiologist (physician) and Advanced Practice Providers (APPs -  Physician Assistants and Nurse Practitioners) who all work together to provide you with the care you need, when you need it.  We recommend signing up for the patient portal called "MyChart".  Sign up information is provided on this After Visit Summary.  MyChart is used to connect with patients for Virtual Visits (Telemedicine).  Patients are able to view lab/test results, encounter notes, upcoming appointments, etc.  Non-urgent messages can be sent to your provider as well.   To learn more about what you can do with MyChart, go to [email or web address].    Your next appointment:   12 month(s)  Provider:   Advanced Practice Provider (NP or PA-C)

## 2022-11-09 LAB — COMPREHENSIVE METABOLIC PANEL
ALT: 19 [IU]/L (ref 0–44)
AST: 27 [IU]/L (ref 0–40)
Albumin: 4.1 g/dL (ref 3.9–4.9)
Alkaline Phosphatase: 102 [IU]/L (ref 44–121)
BUN/Creatinine Ratio: 29 — ABNORMAL HIGH (ref 10–24)
BUN: 35 mg/dL — ABNORMAL HIGH (ref 8–27)
Bilirubin Total: 0.5 mg/dL (ref 0.0–1.2)
CO2: 20 mmol/L (ref 20–29)
Calcium: 10.4 mg/dL — ABNORMAL HIGH (ref 8.6–10.2)
Chloride: 107 mmol/L — ABNORMAL HIGH (ref 96–106)
Creatinine, Ser: 1.22 mg/dL (ref 0.76–1.27)
Globulin, Total: 2.5 g/dL (ref 1.5–4.5)
Glucose: 103 mg/dL — ABNORMAL HIGH (ref 70–99)
Potassium: 5.7 mmol/L — ABNORMAL HIGH (ref 3.5–5.2)
Sodium: 135 mmol/L (ref 134–144)
Total Protein: 6.6 g/dL (ref 6.0–8.5)
eGFR: 67 mL/min/{1.73_m2} (ref >59)

## 2022-11-09 LAB — HEMOGLOBIN A1C
Est. average glucose Bld gHb Est-mCnc: 134 mg/dL
Hgb A1c MFr Bld: 6.3 % — ABNORMAL HIGH (ref 4.8–5.6)

## 2022-11-09 LAB — CBC
Hematocrit: 40.3 % (ref 37.5–51.0)
Hemoglobin: 12.5 g/dL — ABNORMAL LOW (ref 13.0–17.7)
MCH: 28.5 pg (ref 26.6–33.0)
MCHC: 31 g/dL — ABNORMAL LOW (ref 31.5–35.7)
MCV: 92 fL (ref 79–97)
Platelets: 183 10*3/uL (ref 150–450)
RBC: 4.39 x10E6/uL (ref 4.14–5.80)
RDW: 13.5 % (ref 11.6–15.4)
WBC: 7.8 10*3/uL (ref 3.4–10.8)

## 2022-11-09 LAB — TSH RFX ON ABNORMAL TO FREE T4: TSH: 1.65 u[IU]/mL (ref 0.450–4.500)

## 2022-11-18 NOTE — Progress Notes (Signed)
I'm not sure how this patient is listed under me as I have never seen this person before in the office--- please either remove my name as his PCP or please have him set up as a new patient visit.

## 2023-01-10 ENCOUNTER — Ambulatory Visit: Payer: 59

## 2023-01-10 DIAGNOSIS — E785 Hyperlipidemia, unspecified: Secondary | ICD-10-CM

## 2023-01-11 IMAGING — DX DG CHEST 2V
2 series · 2 of 2 positions shown · non-contrast
Comparison: 11/17/2006

CLINICAL DATA: Chest pain

EXAM:
CHEST - 2 VIEW

[x chest ap]
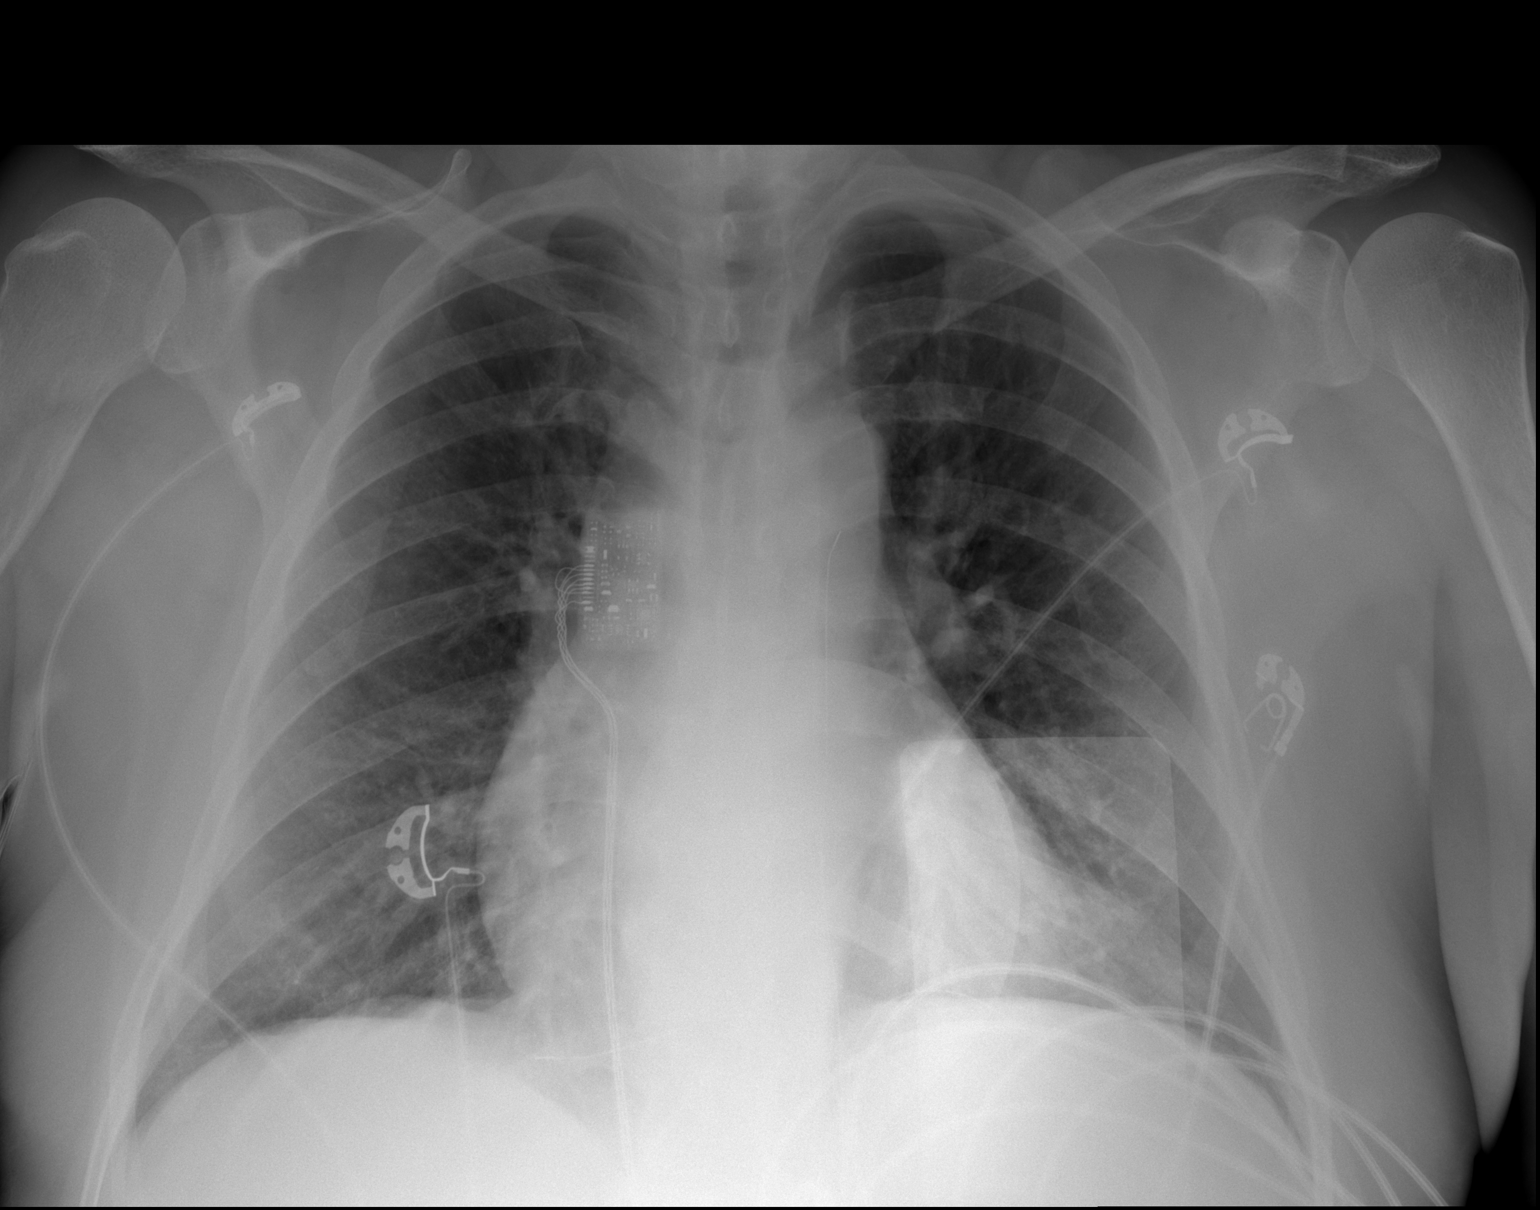

[w chest lat]
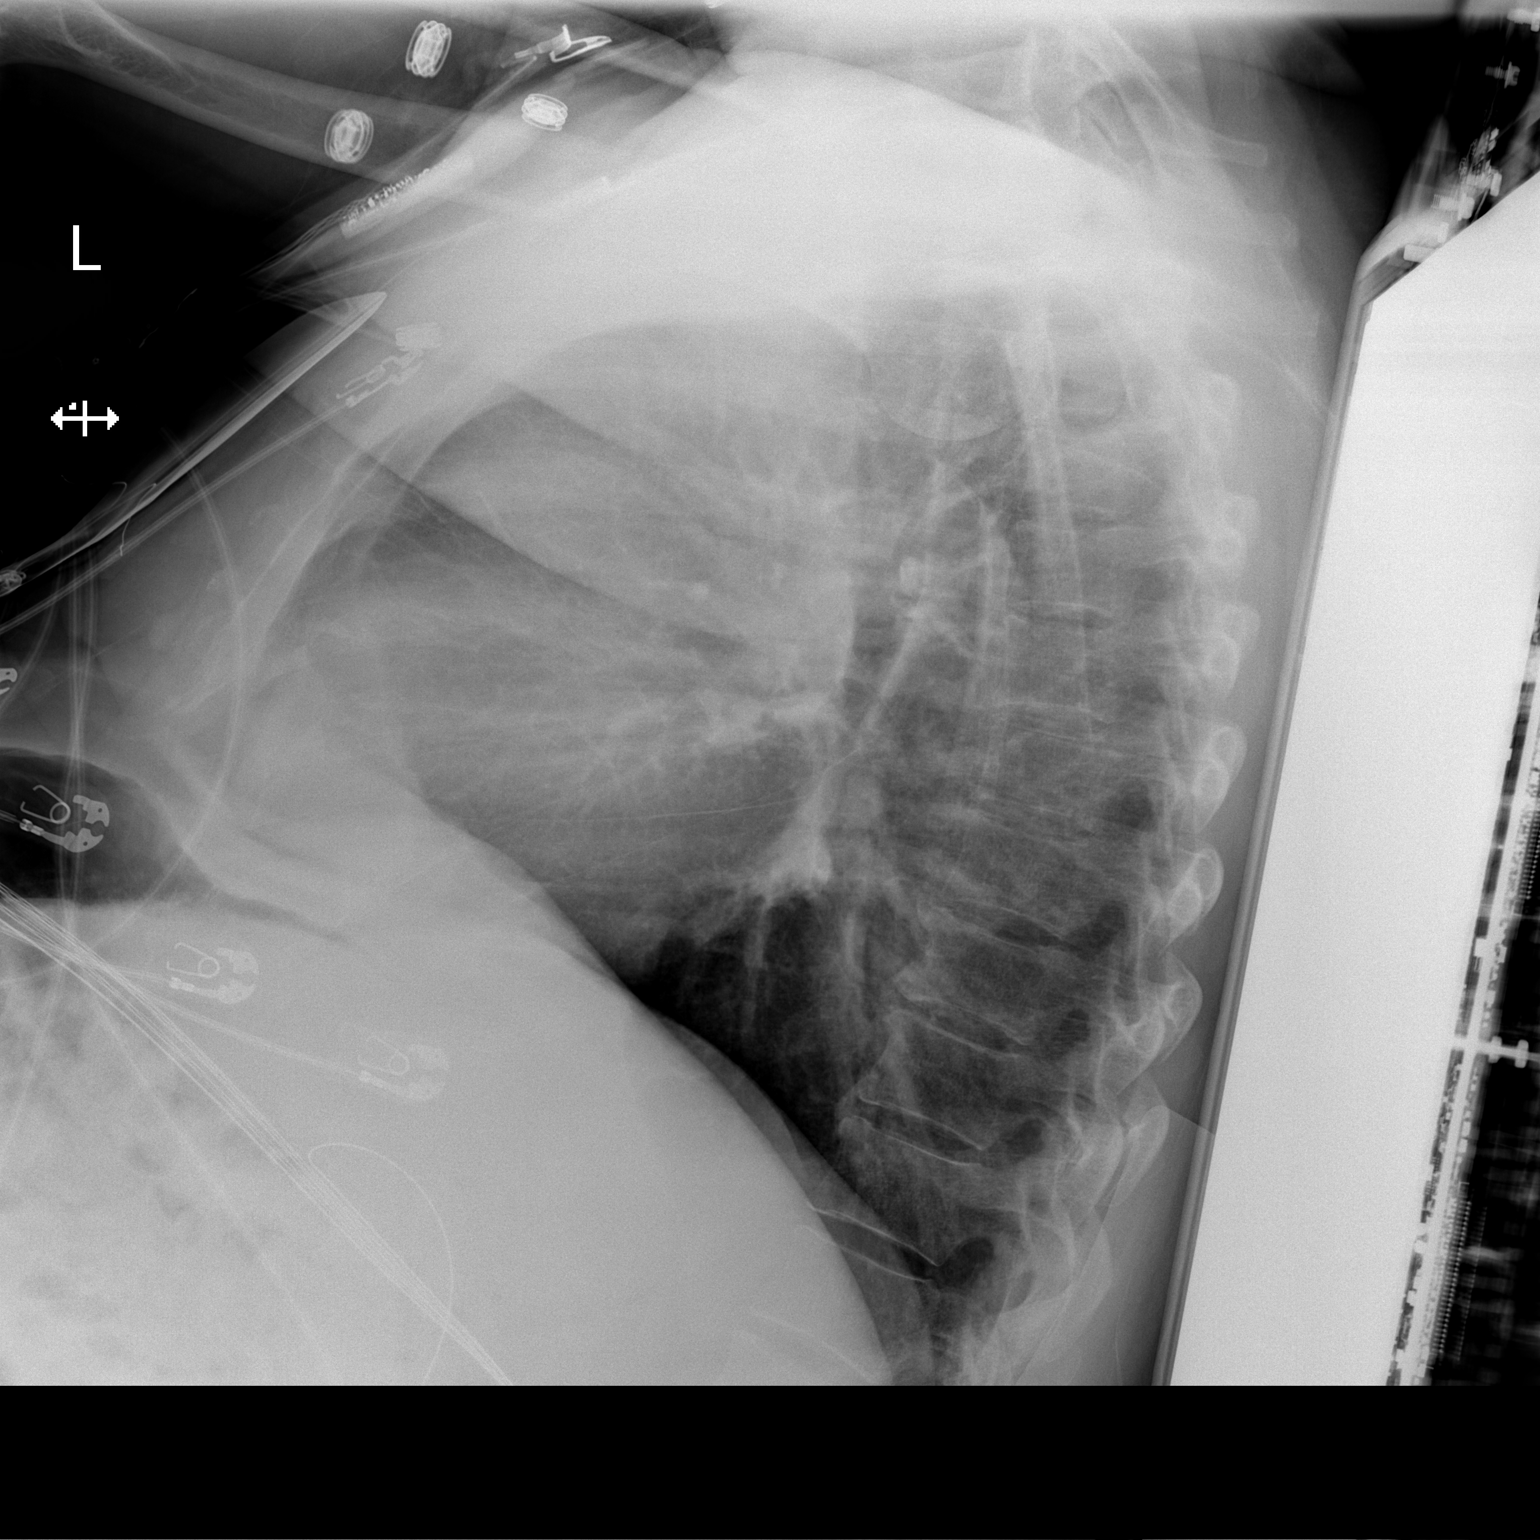

[2 of 2 positions shown; findings below may reference images not displayed]

FINDINGS: Heart is upper limits normal in size. No confluent airspace
opacities or effusions. No edema. No acute bony abnormality.
IMPRESSION: No active cardiopulmonary disease.

## 2023-06-02 ENCOUNTER — Other Ambulatory Visit (HOSPITAL_BASED_OUTPATIENT_CLINIC_OR_DEPARTMENT_OTHER): Payer: Self-pay | Admitting: Family Medicine

## 2023-06-02 ENCOUNTER — Ambulatory Visit (INDEPENDENT_AMBULATORY_CARE_PROVIDER_SITE_OTHER): Admitting: Family Medicine

## 2023-06-02 ENCOUNTER — Encounter (HOSPITAL_BASED_OUTPATIENT_CLINIC_OR_DEPARTMENT_OTHER): Payer: Self-pay | Admitting: Family Medicine

## 2023-06-02 VITALS — BP 137/61 | HR 76 | Ht 62.0 in | Wt 166.7 lb

## 2023-06-02 DIAGNOSIS — G629 Polyneuropathy, unspecified: Secondary | ICD-10-CM | POA: Diagnosis not present

## 2023-06-02 DIAGNOSIS — R7303 Prediabetes: Secondary | ICD-10-CM | POA: Insufficient documentation

## 2023-06-02 DIAGNOSIS — R1013 Epigastric pain: Secondary | ICD-10-CM | POA: Diagnosis not present

## 2023-06-02 DIAGNOSIS — D649 Anemia, unspecified: Secondary | ICD-10-CM | POA: Insufficient documentation

## 2023-06-02 DIAGNOSIS — I10 Essential (primary) hypertension: Secondary | ICD-10-CM | POA: Diagnosis not present

## 2023-06-02 NOTE — Progress Notes (Signed)
 New Patient Office Visit  Subjective   Patient ID: Adrian Peterson, male    DOB: 03-20-1960  Age: 63 y.o. MRN: [id number]  CC:  Chief Complaint  Patient presents with   New Patient (Initial Visit)    New Patient has not had pcp sometimes at night he has noticed some spitting up of blood and feet have been getting numb and has been hurting in his wrist     HPI Adrian Peterson presents to establish care Last PCP - thinks someone through [facility], but not sure In person interpreter utilized for visit  Reports issues with drooling blood when he is sleeping. This happens about once weekly or less. Knows it isn't from his nose. Sees drool on pillow. Never has any daytime symptoms. Does not cough up blood. Has not seen any areas of bleeding in mouth or throat.  Notes that he has had some leg pain and numbness in his feet. This is worse when he gains weight.  Labs completed most recently with his cardiologist did show elevated A1c within prediabetes range, as well as elevated potassium and calcium . Patient does follow with cardiology related to hyperlipidemia and hypertension.  Currently taking atorvastatin , losartan for cholesterol and blood pressure respectively.  Patient does have history of kidney transplant.  Outpatient Encounter Medications as of 06/02/2023  Medication Sig   aspirin  EC 81 MG tablet Take 1 tablet (81 mg total) by mouth daily. Swallow whole.   atorvastatin  (LIPITOR) 40 MG tablet Take 1 tablet (40 mg total) by mouth daily.   losartan (COZAAR) 25 MG tablet Take 25 mg by mouth daily.   mycophenolate  (CELLCEPT ) 500 MG tablet Take 500 mg by mouth 2 (two) times daily.   tacrolimus  (PROGRAF ) 1 MG capsule Take 2 mg by mouth 2 (two) times daily.   [DISCONTINUED] metoprolol  succinate (TOPROL  XL) 25 MG 24 hr tablet Take 1.5 tablets (37.5 mg total) by mouth at bedtime. (Patient not taking: Reported on 06/02/2023)   [DISCONTINUED] metoprolol  tartrate (LOPRESSOR ) 25 MG tablet 25 mg.  Take 1/2 tablet by mouth twice daily (Patient not taking: Reported on 06/02/2023)   No facility-administered encounter medications on file as of 06/02/2023.    Past Medical History:  Diagnosis Date   Kidney disease    SVT (supraventricular tachycardia) (HCC)    UTI (urinary tract infection)     Past Surgical History:  Procedure Laterality Date   KIDNEY TRANSPLANT      History reviewed. No pertinent family history.  Social History   Socioeconomic History   Marital status: Married    Spouse name: Not on file   Number of children: Not on file   Years of education: Not on file   Highest education level: Not on file  Occupational History   Not on file  Tobacco Use   Smoking status: Never    Passive exposure: Never   Smokeless tobacco: Never  Vaping Use   Vaping status: Never Used  Substance and Sexual Activity   Alcohol use: Not Currently   Drug use: Never   Sexual activity: Yes  Other Topics Concern   Not on file  Social History Narrative   Not on file   Social Drivers of Health   Financial Resource Strain: Not on file  Food Insecurity: Not on file  Transportation Needs: Not on file  Physical Activity: Not on file  Stress: Not on file  Social Connections: Not on file  Intimate Partner Violence: Not on file    Objective  BP 137/61 (BP Location: Right Arm, Patient Position: Sitting, Cuff Size: Normal)   Pulse 76   Ht 5\' 2"  (1.575 m)   Wt 166 lb 11.2 oz (75.6 kg)   SpO2 96%   BMI 30.49 kg/m   Physical Exam  63 year old male in no acute distress Cardiovascular exam with regular rate and rhythm Lungs clear to auscultation bilaterally  Assessment & Plan:   Prediabetes Assessment & Plan: A1c elevated previously, due for recheck in order to assess current status and rule out progression to diabetes.  Orders: -     Hemoglobin A1c  Essential (primary) hypertension Assessment & Plan: Blood pressure appropriate in office today on recheck.  Can continue  with current medication regimen, no changes at this time.  Recommend intermittent monitoring of blood pressure at home, DASH diet.  Orders: -     Basic metabolic panel with GFR -     CBC with Differential/Platelet  Neuropathy Assessment & Plan: Discussed considerations related to numbness/tingling in his feet.  Patient would like to have further evaluation with neurologist, feel this would be reasonable.  Referral placed for patient to establish with neurology for further evaluation.  Orders: -     Ambulatory referral to Neurology  Epigastric pain Assessment & Plan: Given epigastric pain is uncertain etiology for infrequent, small amount of blood in his stool, can refer to gastroenterology for further evaluation.  Patient is also overdue for colon cancer screening.  Referral placed today.  Orders: -     Ambulatory referral to Gastroenterology  Anemia, unspecified type Assessment & Plan: This is mild on recent labs.  We can proceed with recheck of labs for follow-up.  Orders: -     CBC with Differential/Platelet  We will repeat labs related to recheck electrolytes, particularly potassium.  Further recommendations pending results of labs.  Return in about 2 months (around 08/02/2023) for hypertension, prediabetes, 40 minutes.    ___________________________________________ Conway Fedora de Peru, MD, ABFM, CAQSM Primary Care and Sports Medicine [facility]

## 2023-06-02 NOTE — Patient Instructions (Signed)
  Medication Instructions:  Your physician recommends that you continue on your current medications as directed. Please refer to the Current Medication list given to you today. --If you need a refill on any your medications before your next appointment, please call your pharmacy first. If no refills are authorized on file call the office.-- Lab Work: Your physician has recommended that you have lab work today: today If you have labs (blood work) drawn today and your tests are completely normal, you will receive your results via MyChart message OR a phone call from our staff.  Please ensure you check your voicemail in the event that you authorized detailed messages to be left on a delegated number. If you have any lab test that is abnormal or we need to change your treatment, we will call you to review the results.  Referrals/Procedures/Imaging: Referrals placed   Follow-Up: Your next appointment:   Your physician recommends that you schedule a follow-up appointment in: 2-3 month follow up  with Dr. de Peru  You will receive a text message or e-mail with a link to a survey about your care and experience with us  today! We would greatly appreciate your feedback!   Thanks for letting us  be apart of your health journey!!  Primary Care and Sports Medicine   Dr. Court Distance Peru   We encourage you to activate your patient portal called "MyChart".  Sign up information is provided on this After Visit Summary.  MyChart is used to connect with patients for Virtual Visits (Telemedicine).  Patients are able to view lab/test results, encounter notes, upcoming appointments, etc.  Non-urgent messages can be sent to your provider as well. To learn more about what you can do with MyChart, please visit --  [email or web address].

## 2023-06-03 LAB — CBC WITH DIFFERENTIAL/PLATELET
Basophils Absolute: 0 10*3/uL (ref 0.0–0.2)
Basos: 0 %
EOS (ABSOLUTE): 0.2 10*3/uL (ref 0.0–0.4)
Eos: 2 %
Hematocrit: 39.5 % (ref 37.5–51.0)
Hemoglobin: 12.9 g/dL — ABNORMAL LOW (ref 13.0–17.7)
Immature Grans (Abs): 0 10*3/uL (ref 0.0–0.1)
Immature Granulocytes: 0 %
Lymphocytes Absolute: 1.7 10*3/uL (ref 0.7–3.1)
Lymphs: 20 %
MCH: 29.3 pg (ref 26.6–33.0)
MCHC: 32.7 g/dL (ref 31.5–35.7)
MCV: 90 fL (ref 79–97)
Monocytes Absolute: 0.8 10*3/uL (ref 0.1–0.9)
Monocytes: 10 %
Neutrophils Absolute: 5.6 10*3/uL (ref 1.4–7.0)
Neutrophils: 68 %
Platelets: 198 10*3/uL (ref 150–450)
RBC: 4.41 x10E6/uL (ref 4.14–5.80)
RDW: 13 % (ref 11.6–15.4)
WBC: 8.2 10*3/uL (ref 3.4–10.8)

## 2023-06-03 LAB — HEMOGLOBIN A1C
Est. average glucose Bld gHb Est-mCnc: 126 mg/dL
Hgb A1c MFr Bld: 6 % — ABNORMAL HIGH (ref 4.8–5.6)

## 2023-06-03 LAB — BASIC METABOLIC PANEL WITH GFR
BUN/Creatinine Ratio: 29 — ABNORMAL HIGH (ref 10–24)
BUN: 34 mg/dL — ABNORMAL HIGH (ref 8–27)
CO2: 19 mmol/L — ABNORMAL LOW (ref 20–29)
Calcium: 10.6 mg/dL — ABNORMAL HIGH (ref 8.6–10.2)
Chloride: 109 mmol/L — ABNORMAL HIGH (ref 96–106)
Creatinine, Ser: 1.16 mg/dL (ref 0.76–1.27)
Glucose: 104 mg/dL — ABNORMAL HIGH (ref 70–99)
Potassium: 4.6 mmol/L (ref 3.5–5.2)
Sodium: 141 mmol/L (ref 134–144)
eGFR: 71 mL/min/{1.73_m2} (ref >59)

## 2023-06-06 NOTE — Assessment & Plan Note (Signed)
 Blood pressure appropriate in office today on recheck.  Can continue with current medication regimen, no changes at this time.  Recommend intermittent monitoring of blood pressure at home, DASH diet.

## 2023-06-06 NOTE — Assessment & Plan Note (Signed)
 A1c elevated previously, due for recheck in order to assess current status and rule out progression to diabetes.

## 2023-06-06 NOTE — Assessment & Plan Note (Signed)
 This is mild on recent labs.  We can proceed with recheck of labs for follow-up.

## 2023-06-06 NOTE — Assessment & Plan Note (Signed)
 Given epigastric pain is uncertain etiology for infrequent, small amount of blood in his stool, can refer to gastroenterology for further evaluation.  Patient is also overdue for colon cancer screening.  Referral placed today.

## 2023-06-06 NOTE — Assessment & Plan Note (Signed)
 Discussed considerations related to numbness/tingling in his feet.  Patient would like to have further evaluation with neurologist, feel this would be reasonable.  Referral placed for patient to establish with neurology for further evaluation.

## 2023-06-13 ENCOUNTER — Ambulatory Visit (HOSPITAL_BASED_OUTPATIENT_CLINIC_OR_DEPARTMENT_OTHER): Payer: Self-pay | Admitting: Family Medicine

## 2023-06-13 ENCOUNTER — Encounter: Payer: Self-pay | Admitting: Neurology

## 2023-06-20 ENCOUNTER — Encounter (HOSPITAL_BASED_OUTPATIENT_CLINIC_OR_DEPARTMENT_OTHER): Payer: Self-pay | Admitting: *Deleted

## 2023-07-02 NOTE — Telephone Encounter (Signed)
 Copied from CRM [id number]. Topic: Clinical - Lab/Test Results >> Jun 18, 2023  3:18 PM Oddis Bench wrote: Reason for CRM: Provided patient with lab results   06/13/23 10:25 AM Result Note White blood cell and red blood cell count are normal.  Hemoglobin A1c remains within prediabetes range at this time.  Electrolytes and kidney function remain stable. Basic Metabolic Panel (BMET); CBC with Differential/Platelet; Hemoglobin A1c >> Jul 02, 2023  4:48 PM Carla L wrote: Pt returning call after received letter about lab results. Pt states he was given results a few weeks ago. Pt had no further questions.

## 2023-07-02 NOTE — Telephone Encounter (Signed)
 Noted.  Will close encounter.

## 2023-08-11 ENCOUNTER — Encounter: Payer: Self-pay | Admitting: Neurology

## 2023-08-11 ENCOUNTER — Ambulatory Visit: Admitting: Neurology

## 2023-08-11 VITALS — BP 114/66 | HR 62 | Ht 62.0 in | Wt 165.0 lb

## 2023-08-11 DIAGNOSIS — R200 Anesthesia of skin: Secondary | ICD-10-CM | POA: Diagnosis not present

## 2023-08-11 DIAGNOSIS — R202 Paresthesia of skin: Secondary | ICD-10-CM

## 2023-08-11 NOTE — Patient Instructions (Signed)
Nerve testing of both legs.  ELECTROMYOGRAM AND NERVE CONDUCTION STUDIES (EMG/NCS) INSTRUCTIONS  How to Prepare The neurologist conducting the EMG will need to know if you have certain medical conditions. Tell the neurologist and other EMG lab personnel if you: . Have a pacemaker or any other electrical medical device . Take blood-thinning medications . Have hemophilia, a blood-clotting disorder that causes prolonged bleeding Bathing Take a shower or bath shortly before your exam in order to remove oils from your skin. Don't apply lotions or creams before the exam.  What to Expect You'll likely be asked to change into a hospital gown for the procedure and lie down on an examination table. The following explanations can help you understand what will happen during the exam.  . Electrodes. The neurologist or a technician places surface electrodes at various locations on your skin depending on where you're experiencing symptoms. Or the neurologist may insert needle electrodes at different sites depending on your symptoms.  . Sensations. The electrodes will at times transmit a tiny electrical current that you may feel as a twinge or spasm. The needle electrode may cause discomfort or pain that usually ends shortly after the needle is removed. If you are concerned about discomfort or pain, you may want to talk to the neurologist about taking a short break during the exam.  . Instructions. During the needle EMG, the neurologist will assess whether there is any spontaneous electrical activity when the muscle is at rest - activity that isn't present in healthy muscle tissue - and the degree of activity when you slightly contract the muscle.  He or she will give you instructions on resting and contracting a muscle at appropriate times. Depending on what muscles and nerves the neurologist is examining, he or she may ask you to change positions during the exam.  After your EMG You may experience some  temporary, minor bruising where the needle electrode was inserted into your muscle. This bruising should fade within several days. If it persists, contact your primary care doctor.    

## 2023-08-11 NOTE — Progress Notes (Signed)
 [facility] Clinic Note - Initial Visit   Date: 08/11/2023   Adrian Peterson MRN: [id number] DOB: May 31, 1960   Dear Dr. de Peru:   Thank you for your kind referral of Adrian Peterson for consultation of bilateral feet numbness/tingling. Although his history is well known to you, please allow us  to reiterate it for the purpose of our medical record. The patient was accompanied to the clinic by Spanish interpretor who also provides collateral information.     [name] is a 63 y.o. right-handed male with CAD, hyperlipidemia, DVT, history of kidney transplant (congenital kidney issue, on dialysis since age of 44) presenting for evaluation of bilateral feet numbness.   IMPRESSION/PLAN: Bilateral feet numbness and tingling.  Exam shows mildly reduced distal reflexes, otherwise normal sensation and strength.  To evaluate for neuropathy, nerve testing of both legs will be ordered.    Further recommendations pending results.   ------------------------------------------------------------- History of present illness: For the past 2-3 months, he has been having numbness/tingling in the soles of the feet.   He also has achy pain involving the heels bilaterally.  Symptoms are worse at the end of the day and with activity.  He denies weakness.  Sometimes he has imbalance.  Nothing makes symptoms better or worse.  He also has intermittent numbness/tingling in the hands.   He works as Personnel officer.  Nonsmoker.  No alcohol use.   Out-side paper records, electronic medical record, and images have been reviewed where available and summarized as:  Lab Results  Component Value Date   HGBA1C 6.0 (H) 06/02/2023   No results found for: [id number] Lab Results  Component Value Date   TSH 1.650 11/08/2022   No results found for: ESRSEDRATE, POCTSEDRATE  Past Medical History:  Diagnosis Date   Kidney disease    SVT (supraventricular tachycardia) (HCC)    UTI  (urinary tract infection)     Past Surgical History:  Procedure Laterality Date   KIDNEY TRANSPLANT       Medications:  Outpatient Encounter Medications as of 08/11/2023  Medication Sig   aspirin  EC 81 MG tablet Take 1 tablet (81 mg total) by mouth daily. Swallow whole.   atorvastatin  (LIPITOR) 40 MG tablet Take 1 tablet (40 mg total) by mouth daily.   losartan (COZAAR) 25 MG tablet Take 25 mg by mouth daily.   mycophenolate  (CELLCEPT ) 500 MG tablet Take 500 mg by mouth 2 (two) times daily.   tacrolimus  (PROGRAF ) 1 MG capsule Take 2 mg by mouth 2 (two) times daily.   No facility-administered encounter medications on file as of 08/11/2023.    Allergies: No Known Allergies  Family History: Family History  Problem Relation Age of Onset   Hypertension Mother    Cancer Father     Social History: Social History   Tobacco Use   Smoking status: Never    Passive exposure: Never   Smokeless tobacco: Never  Vaping Use   Vaping status: Never Used  Substance Use Topics   Alcohol use: Not Currently   Drug use: Never   Social History   Social History Narrative   Are you right handed or left handed? Right Handed    Are you currently employed ? Yes   What is your current occupation? [employer]    Do you live at home alone? No    Who lives with you? Spouse    What type of home do you live in: 1 story or 2 story? Lives in a one  story home         Vital Signs:  BP 114/66   Pulse 62   Ht 5' 2 (1.575 m)   Wt 165 lb (74.8 kg)   SpO2 98%   BMI 30.18 kg/m   Neurological Exam: MENTAL STATUS including orientation to time, place, person, recent and remote memory, attention span and concentration, language, and fund of knowledge is normal.  Speech is not dysarthric.  CRANIAL NERVES: II:  No visual field defects.     III-IV-VI: Pupils equal round and reactive to light.  Normal conjugate, extra-ocular eye movements in all directions of gaze.  No nystagmus.  No ptosis.   V:   Normal facial sensation.    VII:  Normal facial symmetry and movements.   VIII:  Normal hearing and vestibular function.   IX-X:  Normal palatal movement.   XI:  Normal shoulder shrug and head rotation.   XII:  Normal tongue strength and range of motion, no deviation or fasciculation.  MOTOR:  No atrophy, fasciculations or abnormal movements.  No pronator drift.   Upper Extremity:  Right  Left  Deltoid  5/5   5/5   Biceps  5/5   5/5   Triceps  5/5   5/5   Wrist extensors  5/5   5/5   Wrist flexors  5/5   5/5   Finger extensors  5/5   5/5   Finger flexors  5/5   5/5   Dorsal interossei  5/5   5/5   Abductor pollicis  5/5   5/5   Tone (Ashworth scale)  0  0   Lower Extremity:  Right  Left  Hip flexors  5/5   5/5   Knee flexors  5/5   5/5   Knee extensors  5/5   5/5   Dorsiflexors  5/5   5/5   Plantarflexors  5/5   5/5   Toe extensors  5/5   5/5   Toe flexors  5/5   5/5   Tone (Ashworth scale)  0  0   MSRs:                                           Right        Left brachioradialis 2+  2+  biceps 2+  2+  triceps 2+  2+  patellar 1+  1+  ankle jerk 0  0  Hoffman no  no  plantar response down  down   SENSORY:  Normal and symmetric perception of light touch, pinprick, vibration, and temperature.  Romberg's sign absent.   COORDINATION/GAIT: Normal finger-to- nose-finger.  Intact rapid alternating movements bilaterally.   Gait narrow based and stable. Tandem and stressed gait intact.     Thank you for allowing me to participate in patient's care.  If I can answer any additional questions, I would be pleased to do so.    Sincerely,    Muhannad Bignell K. Tobie, DO

## 2023-09-02 ENCOUNTER — Ambulatory Visit (HOSPITAL_BASED_OUTPATIENT_CLINIC_OR_DEPARTMENT_OTHER): Admitting: Family Medicine

## 2023-09-03 ENCOUNTER — Telehealth (HOSPITAL_BASED_OUTPATIENT_CLINIC_OR_DEPARTMENT_OTHER): Payer: Self-pay | Admitting: *Deleted

## 2023-09-03 ENCOUNTER — Encounter (HOSPITAL_BASED_OUTPATIENT_CLINIC_OR_DEPARTMENT_OTHER): Payer: Self-pay | Admitting: Family Medicine

## 2023-09-03 ENCOUNTER — Ambulatory Visit (INDEPENDENT_AMBULATORY_CARE_PROVIDER_SITE_OTHER): Admitting: Family Medicine

## 2023-09-03 VITALS — BP 145/68 | HR 73 | Ht 62.0 in | Wt 166.1 lb

## 2023-09-03 DIAGNOSIS — R7303 Prediabetes: Secondary | ICD-10-CM

## 2023-09-03 DIAGNOSIS — I10 Essential (primary) hypertension: Secondary | ICD-10-CM

## 2023-09-03 DIAGNOSIS — Z940 Kidney transplant status: Secondary | ICD-10-CM | POA: Diagnosis not present

## 2023-09-03 DIAGNOSIS — Z0000 Encounter for general adult medical examination without abnormal findings: Secondary | ICD-10-CM

## 2023-09-03 NOTE — Assessment & Plan Note (Signed)
 Patient has been following with nephrologist through [facility], however given changes insurance, his current provider is now out of network.  He is needing referral to new nephrologist to establish with 1 locally.  Referral to nephrology placed today

## 2023-09-03 NOTE — Assessment & Plan Note (Signed)
 A1c at last office visit did show improvement, still within the prediabetes range.  Patient continues with lifestyle modifications, no concerns today such as new symptoms including polyuria or polydipsia. Can continue with lifestyle modifications, we will continue with intermittent monitoring of A1c.

## 2023-09-03 NOTE — Telephone Encounter (Signed)
 Copied from CRM #[id number]. Topic: General - Running Late >> Sep 03, 2023  1:58 PM Nathanel BROCKS wrote: Patient/patient representative is calling because they are running late for an appointment.   Pt got confused with appt at another drs office, they called to tell me that he was on his way and I advised the office.

## 2023-09-03 NOTE — Assessment & Plan Note (Signed)
 Blood pressure appropriate in office today on recheck.  Can continue with current medication regimen, no changes at this time.  Recommend intermittent monitoring of blood pressure at home, DASH diet.

## 2023-09-03 NOTE — Telephone Encounter (Signed)
 noted

## 2023-09-03 NOTE — Progress Notes (Signed)
    Procedures performed today:    None.  Independent interpretation of notes and tests performed by another provider:   None.  Brief History, Exam, Impression, and Recommendations:    BP (!) 148/67 (BP Location: Right Arm, Patient Position: Sitting, Cuff Size: Normal)   Pulse 73   Ht 5' 2 (1.575 m)   Wt 166 lb 1.6 oz (75.3 kg)   SpO2 96%   BMI 30.38 kg/m   Prediabetes Assessment & Plan: A1c at last office visit did show improvement, still within the prediabetes range.  Patient continues with lifestyle modifications, no concerns today such as new symptoms including polyuria or polydipsia. Can continue with lifestyle modifications, we will continue with intermittent monitoring of A1c.   H/O kidney transplant Assessment & Plan: Patient has been following with nephrologist through [facility], however given changes insurance, his current provider is now out of network.  He is needing referral to new nephrologist to establish with 1 locally.  Referral to nephrology placed today  Orders: -     Ambulatory referral to Nephrology  Essential (primary) hypertension Assessment & Plan: Blood pressure appropriate in office today on recheck.  Can continue with current medication regimen, no changes at this time.  Recommend intermittent monitoring of blood pressure at home, DASH diet.   Wellness examination -     CBC with Differential/Platelet; Future -     Comprehensive metabolic panel with GFR; Future -     Hemoglobin A1c; Future -     Lipid panel; Future -     TSH Rfx on Abnormal to Free T4; Future  Return in about 4 months (around 01/03/2024) for CPE with fasting labs 1 week prior, 40 minutes.   ___________________________________________ Olivine Hiers de [place], MD, ABFM, CAQSM Primary Care and Sports Medicine [facility]

## 2023-09-03 NOTE — Patient Instructions (Signed)
  Medication Instructions:  Your physician recommends that you continue on your current medications as directed. Please refer to the Current Medication list given to you today. --If you need a refill on any your medications before your next appointment, please call your pharmacy first. If no refills are authorized on file call the office.-- Lab Work: Your physician has recommended that you have lab work today: 1 week before phsyical If you have labs (blood work) drawn today and your tests are completely normal, you will receive your results via MyChart message OR a phone call from our staff.  Please ensure you check your voicemail in the event that you authorized detailed messages to be left on a delegated number. If you have any lab test that is abnormal or we need to change your treatment, we will call you to review the results.  Referrals/Procedures/Imaging: yes  Follow-Up: Your next appointment:   Your physician recommends that you schedule a follow-up appointment in: 4 months for physical with Dr. de Peru  You will receive a text message or e-mail with a link to a survey about your care and experience with us  today! We would greatly appreciate your feedback!   Thanks for letting us  be apart of your health journey!!  Primary Care and Sports Medicine   Dr. Quintin sheerer Peru   We encourage you to activate your patient portal called MyChart.  Sign up information is provided on this After Visit Summary.  MyChart is used to connect with patients for Virtual Visits (Telemedicine).  Patients are able to view lab/test results, encounter notes, upcoming appointments, etc.  Non-urgent messages can be sent to your provider as well. To learn more about what you can do with MyChart, please visit --  [email or web address].

## 2023-09-18 ENCOUNTER — Ambulatory Visit: Admitting: Neurology

## 2023-09-18 DIAGNOSIS — R202 Paresthesia of skin: Secondary | ICD-10-CM

## 2023-09-18 DIAGNOSIS — R200 Anesthesia of skin: Secondary | ICD-10-CM | POA: Diagnosis not present

## 2023-09-18 NOTE — Procedures (Signed)
 [facility]  [address], [address]  [place], [place] [address] Tel: [phone] Fax: [phone] Test Date:  09/18/2023  Patient: Adrian Peterson DOB: May 20, 1960 Physician: Tonita Blanch, DO  Sex: Male Height: 5' 2 Ref Phys: Tonita Blanch, DO  ID#: [id number]   Technician:    History: This is a 63 year old man referred for evaluation of bilateral feet pain.  NCV & EMG Findings: Electrodiagnostic testing of the right lower extremity and additional studies of the left shows: Bilateral sural and superficial peroneal sensory responses are within normal limits. Right peroneal motor response is within normal limits.  Left peroneal motor response is reduced at the extensor digitorum brevis, and normal at the tibialis anterior.  Bilateral tibial motor responses show reduced amplitude.  In isolation, these findings are of unclear clinical significance. Bilateral tibial H reflex studies are within normal limits. There is no evidence of active or chronic motor axonal changes affecting any of the tested muscles.  Motor unit configuration and recruitment pattern is within normal limits.   Impression: This is a normal study of the lower extremities.  In particular, there is no evidence of a large fiber sensorimotor polyneuropathy or lumbosacral radiculopathy.    ___________________________ Tonita Blanch, DO    Nerve Conduction Studies   Stim Site NR Peak (ms) Norm Peak (ms) O-P Amp (V) Norm O-P Amp  Left Sup Peroneal Anti Sensory (Ant Lat Mall)  32 C  12 cm    2.5 <4.6 16.4 >3  Right Sup Peroneal Anti Sensory (Ant Lat Mall)  32 C  12 cm    2.3 <4.6 14.6 >3  Left Sural Anti Sensory (Lat Mall)  32 C  Calf    3.0 <4.6 13.6 >3  Right Sural Anti Sensory (Lat Mall)  32 C  Calf    2.8 <4.6 13.9 >3     Stim Site NR Onset (ms) Norm Onset (ms) O-P Amp (mV) Norm O-P Amp Site1 Site2 Delta-0 (ms) Dist (cm) Vel (m/s) Norm Vel (m/s)  Left Peroneal Motor (Ext Dig Brev)  32 C   Ankle    3.5 <6.0 *1.9 >2.5 B Fib Ankle 6.6 32.0 48 >40  B Fib    10.1  1.8  Poplt B Fib 1.5 8.0 53 >40  Poplt    11.6  1.7         Right Peroneal Motor (Ext Dig Brev)  32 C  Ankle    3.8 <6.0 3.2 >2.5 B Fib Ankle 6.0 30.0 50 >40  B Fib    9.8  2.9  Poplt B Fib 1.3 7.0 54 >40  Poplt    11.1  2.8         Left Peroneal TA Motor (Tib Ant)  32 C  Fib Head    2.7 <4.5 4.5 >3 Poplit Fib Head 1.4 8.0 57 >40  Poplit    4.1 <5.7 4.5         Left Tibial Motor (Abd Hall Brev)  32 C  Ankle    4.8 <6.0 *0.7 >4 Knee Ankle  0.0  >40  Knee *NR            Right Tibial Motor (Abd Hall Brev)  32 C  Ankle    3.2 <6.0 *1.2 >4 Knee Ankle 7.2 38.0 53 >40  Knee    10.4  1.2          Electromyography   Side Muscle Ins.Act Fibs Fasc Recrt Amp Dur Poly Activation Comment  Right  AntTibialis Nml Nml Nml Nml Nml Nml Nml Nml N/A  Right Gastroc Nml Nml Nml Nml Nml Nml Nml Nml N/A  Right Flex Dig Long Nml Nml Nml Nml Nml Nml Nml Nml N/A  Right RectFemoris Nml Nml Nml Nml Nml Nml Nml Nml N/A  Right BicepsFemS Nml Nml Nml Nml Nml Nml Nml Nml N/A  Right GluteusMed Nml Nml Nml Nml Nml Nml Nml Nml N/A  Left AntTibialis Nml Nml Nml Nml Nml Nml Nml Nml N/A  Left Gastroc Nml Nml Nml Nml Nml Nml Nml Nml N/A  Left Flex Dig Long Nml Nml Nml Nml Nml Nml Nml Nml N/A  Left RectFemoris Nml Nml Nml Nml Nml Nml Nml Nml N/A  Left GluteusMed Nml Nml Nml Nml Nml Nml Nml Nml N/A  Left BicepsFemS Nml Nml Nml Nml Nml Nml Nml Nml N/A     Waveforms:

## 2023-09-19 ENCOUNTER — Ambulatory Visit: Payer: Self-pay | Admitting: Neurology

## 2024-01-23 ENCOUNTER — Ambulatory Visit (INDEPENDENT_AMBULATORY_CARE_PROVIDER_SITE_OTHER): Admitting: Family Medicine

## 2024-01-23 ENCOUNTER — Encounter (HOSPITAL_BASED_OUTPATIENT_CLINIC_OR_DEPARTMENT_OTHER): Payer: Self-pay | Admitting: Family Medicine

## 2024-01-23 VITALS — BP 153/67 | HR 67 | Ht 62.0 in | Wt 163.0 lb

## 2024-01-23 DIAGNOSIS — Z0000 Encounter for general adult medical examination without abnormal findings: Secondary | ICD-10-CM | POA: Diagnosis not present

## 2024-01-23 MED ORDER — SILDENAFIL CITRATE 50 MG PO TABS: 50.0000 mg | ORAL_TABLET | Freq: Every day | ORAL | 2 refills | Status: AC | PRN

## 2024-01-23 NOTE — Progress Notes (Signed)
 " Subjective:    CC: Annual Physical Exam  HPI:  Adrian Peterson is a 63 y.o. presenting for annual physical  I reviewed the past medical history, family history, social history, surgical history, and allergies today and no changes were needed.  Please see the problem list section below in epic for further details.  Past Medical History: Past Medical History:  Diagnosis Date   Kidney disease    SVT (supraventricular tachycardia)    UTI (urinary tract infection)    Past Surgical History: Past Surgical History:  Procedure Laterality Date   KIDNEY TRANSPLANT     Social History: Social History   Socioeconomic History   Marital status: Married    Spouse name: Not on file   Number of children: Not on file   Years of education: Not on file   Highest education level: Not on file  Occupational History   Not on file  Tobacco Use   Smoking status: Never    Passive exposure: Never   Smokeless tobacco: Never  Vaping Use   Vaping status: Never Used  Substance and Sexual Activity   Alcohol use: Not Currently   Drug use: Never   Sexual activity: Yes  Other Topics Concern   Not on file  Social History Narrative   Are you right handed or left handed? Right Handed    Are you currently employed ? Yes   What is your current occupation? [employer]    Do you live at home alone? No    Who lives with you? Spouse    What type of home do you live in: 1 story or 2 story? Lives in a one story home        Social Drivers of Health   Tobacco Use: Low Risk (01/23/2024)   Patient History    Smoking Tobacco Use: Never    Smokeless Tobacco Use: Never    Passive Exposure: Never  Financial Resource Strain: Not on file  Food Insecurity: Not on file  Transportation Needs: Not on file  Physical Activity: Not on file  Stress: Not on file  Social Connections: Not on file  Depression (PHQ2-9): Low Risk (01/23/2024)   Depression (PHQ2-9)    PHQ-2 Score: 0  Alcohol Screen: Not on file   Housing: Not on file  Utilities: Not on file  Health Literacy: Not on file   Family History: Family History  Problem Relation Age of Onset   Hypertension Mother    Cancer Father    Allergies: Allergies[1] Medications: See med rec.  Review of Systems: No headache, visual changes, nausea, vomiting, diarrhea, constipation, dizziness, abdominal pain, skin rash, fevers, chills, night sweats, swollen lymph nodes, weight loss, chest pain, body aches, joint swelling, muscle aches, shortness of breath, mood changes, visual or auditory hallucinations.  Objective:    BP (!) 153/67 (BP Location: Right Arm, Patient Position: Sitting, Cuff Size: Normal)   Pulse 67   Ht 5' 2 (1.575 m)   Wt 163 lb (73.9 kg)   SpO2 97%   BMI 29.81 kg/m   General: Well Developed, well nourished, and in no acute distress. Neuro: Alert and oriented x3, extra-ocular muscles intact, sensation grossly intact. Cranial nerves II through XII are intact, motor, sensory, and coordinative functions are all intact. HEENT: Normocephalic, atraumatic, pupils equal round reactive to light, neck supple, no masses, no lymphadenopathy, thyroid  nonpalpable. Oropharynx, nasopharynx, external ear canals are unremarkable. Skin: Warm and dry, no rashes noted. Cardiac: Regular rate and rhythm, no murmurs rubs  or gallops. Respiratory: Clear to auscultation bilaterally. Not using accessory muscles, speaking in full sentences. Abdominal: Soft, nontender, nondistended, positive bowel sounds, no masses, no organomegaly. Musculoskeletal: Shoulder, elbow, wrist, hip, knee, ankle stable, and with full range of motion.  Impression and Recommendations:    Wellness examination Assessment & Plan: Routine HCM labs ordered. HCM reviewed/discussed. Anticipatory guidance regarding healthy weight, lifestyle and choices given. Recommend healthy diet.  Recommend approximately 150 minutes/week of moderate intensity exercise Recommend regular dental  and vision exams Always use seatbelt/lap and shoulder restraints Recommend using smoke alarms and checking batteries at least twice a year Recommend using sunscreen when outside Discussed colon cancer screening recommendations, options.  Patient had colonoscopy through [facility] in April 2022 - recommendation for 10 year recall Discussed immunization recommendations  Orders: -     CBC with Differential/Platelet -     Comprehensive metabolic panel with GFR -     Hemoglobin A1c -     Lipid panel -     TSH Rfx on Abnormal to Free T4  Hypercalcemia -     Calcium , ionized -     Parathyroid hormone, intact (no Ca)  Other orders -     Sildenafil  Citrate; Take 1-2 tablets (50-100 mg total) by mouth daily as needed for erectile dysfunction.  Dispense: 10 tablet; Refill: 2  At conclusion of visit, patient also noted issues with erectile dysfunction.  We discussed considerations and details.  We can trial patient on phosphodiesterase inhibitor, cautioned on potential side effects.  Return in about 4 months (around 05/23/2024) for prediabetes, hyperlipidemia.    ___________________________________________ Shilah Hefel de [place], MD, ABFM, [facility]    [1] No Known Allergies  "

## 2024-01-23 NOTE — Patient Instructions (Signed)
" °  Medication Instructions:  Your physician recommends that you continue on your current medications as directed. Please refer to the Current Medication list given to you today. --If you need a refill on any your medications before your next appointment, please call your pharmacy first. If no refills are authorized on file call the office.-- Lab Work: Your physician has recommended that you have lab work today: yes If you have labs (blood work) drawn today and your tests are completely normal, you will receive your results via MyChart message OR a phone call from our staff.  Please ensure you check your voicemail in the event that you authorized detailed messages to be left on a delegated number. If you have any lab test that is abnormal or we need to change your treatment, we will call you to review the results.  Referrals/Procedures/Imaging: Phone number for [facility] is [phone]  Follow-Up: Your next appointment:   Your physician recommends that you schedule a follow-up appointment in: 4 months with Dr. de Cuba  You will receive a text message or e-mail with a link to a survey about your care and experience with us  today! We would greatly appreciate your feedback!   Thanks for letting us  be apart of your health journey!!  Primary Care and Sports Medicine   Dr. Quintin sheerer Cuba   We encourage you to activate your patient portal called MyChart.  Sign up information is provided on this After Visit Summary.  MyChart is used to connect with patients for Virtual Visits (Telemedicine).  Patients are able to view lab/test results, encounter notes, upcoming appointments, etc.  Non-urgent messages can be sent to your provider as well. To learn more about what you can do with MyChart, please visit --  [email or web address].    "

## 2024-01-23 NOTE — Assessment & Plan Note (Signed)
 Routine HCM labs ordered. HCM reviewed/discussed. Anticipatory guidance regarding healthy weight, lifestyle and choices given. Recommend healthy diet.  Recommend approximately 150 minutes/week of moderate intensity exercise Recommend regular dental and vision exams Always use seatbelt/lap and shoulder restraints Recommend using smoke alarms and checking batteries at least twice a year Recommend using sunscreen when outside Discussed colon cancer screening recommendations, options.  Patient had colonoscopy through [facility] in April 2022 - recommendation for 10 year recall Discussed immunization recommendations

## 2024-01-25 LAB — CBC WITH DIFFERENTIAL/PLATELET
Basophils Absolute: 0 x10E3/uL (ref 0.0–0.2)
Basos: 0 %
EOS (ABSOLUTE): 0.2 x10E3/uL (ref 0.0–0.4)
Eos: 2 %
Hematocrit: 41.8 % (ref 37.5–51.0)
Hemoglobin: 13.3 g/dL (ref 13.0–17.7)
Immature Grans (Abs): 0 x10E3/uL (ref 0.0–0.1)
Immature Granulocytes: 0 %
Lymphocytes Absolute: 1.6 x10E3/uL (ref 0.7–3.1)
Lymphs: 21 %
MCH: 28.5 pg (ref 26.6–33.0)
MCHC: 31.8 g/dL (ref 31.5–35.7)
MCV: 90 fL (ref 79–97)
Monocytes Absolute: 0.7 x10E3/uL (ref 0.1–0.9)
Monocytes: 9 %
Neutrophils Absolute: 5.1 x10E3/uL (ref 1.4–7.0)
Neutrophils: 68 %
Platelets: 214 x10E3/uL (ref 150–450)
RBC: 4.67 x10E6/uL (ref 4.14–5.80)
RDW: 13.2 % (ref 11.6–15.4)
WBC: 7.7 x10E3/uL (ref 3.4–10.8)

## 2024-01-25 LAB — LIPID PANEL
Chol/HDL Ratio: 3.7 ratio (ref 0.0–5.0)
Cholesterol, Total: 104 mg/dL (ref 100–199)
HDL: 28 mg/dL — ABNORMAL LOW
LDL Chol Calc (NIH): 59 mg/dL (ref 0–99)
Triglycerides: 86 mg/dL (ref 0–149)
VLDL Cholesterol Cal: 17 mg/dL (ref 5–40)

## 2024-01-25 LAB — COMPREHENSIVE METABOLIC PANEL WITH GFR
ALT: 10 IU/L (ref 0–44)
AST: 23 IU/L (ref 0–40)
Albumin: 4.4 g/dL (ref 3.9–4.9)
Alkaline Phosphatase: 104 IU/L (ref 47–123)
BUN/Creatinine Ratio: 33 — ABNORMAL HIGH (ref 10–24)
BUN: 42 mg/dL — ABNORMAL HIGH (ref 8–27)
Bilirubin Total: 0.5 mg/dL (ref 0.0–1.2)
CO2: 17 mmol/L — ABNORMAL LOW (ref 20–29)
Calcium: 10.6 mg/dL — ABNORMAL HIGH (ref 8.6–10.2)
Chloride: 108 mmol/L — ABNORMAL HIGH (ref 96–106)
Creatinine, Ser: 1.28 mg/dL — ABNORMAL HIGH (ref 0.76–1.27)
Globulin, Total: 2.9 g/dL (ref 1.5–4.5)
Glucose: 104 mg/dL — ABNORMAL HIGH (ref 70–99)
Potassium: 5.2 mmol/L (ref 3.5–5.2)
Sodium: 138 mmol/L (ref 134–144)
Total Protein: 7.3 g/dL (ref 6.0–8.5)
eGFR: 63 mL/min/1.73

## 2024-01-25 LAB — PARATHYROID HORMONE, INTACT (NO CA): PTH: 83 pg/mL — ABNORMAL HIGH (ref 15–65)

## 2024-01-25 LAB — HEMOGLOBIN A1C
Est. average glucose Bld gHb Est-mCnc: 128 mg/dL
Hgb A1c MFr Bld: 6.1 % — ABNORMAL HIGH (ref 4.8–5.6)

## 2024-01-25 LAB — CALCIUM, IONIZED: Calcium, Ion: 5.6 mg/dL (ref 4.5–5.6)

## 2024-01-25 LAB — TSH RFX ON ABNORMAL TO FREE T4: TSH: 1.72 u[IU]/mL (ref 0.450–4.500)

## 2024-03-01 ENCOUNTER — Ambulatory Visit: Payer: Self-pay

## 2024-03-01 NOTE — Telephone Encounter (Signed)
 FYI Only or Action Required?: FYI only for provider: appointment scheduled on 03/02/24.  Patient was last seen in primary care on 01/23/2024 by de Cuba, Quintin PARAS, MD.  Called Nurse Triage reporting Back Pain.  Symptoms began several weeks ago.  Interventions attempted: Nothing.  Symptoms are: gradually worsening.  Triage Disposition: See PCP When Office is Open (Within 3 Days)  Patient/caregiver understands and will follow disposition?: Yes                                  1. ONSET: When did the pain begin? (e.g., minutes, hours, days)     Pain has worsened within past 20 days 2. LOCATION: Where does it hurt? (upper, mid or lower back)     Upper right side under shoulder  3. SEVERITY: How bad is the pain?  (e.g., Scale 1-10; mild, moderate, or severe)     Rates pain 6-7 at this time 5. RADIATION: Does the pain shoot into your legs or somewhere else?     Denies 6. CAUSE:  What do you think is causing the back pain?      A fall about a month ago 8. MEDICINES: What have you taken so far for the pain? (e.g., nothing, acetaminophen, NSAIDS)     Denies 9. NEUROLOGIC SYMPTOMS: Do you have any weakness, numbness, or problems with bowel/bladder control?     Walking is painful after sitting for periods of time 10. OTHER SYMPTOMS: Do you have any other symptoms? (e.g., fever, abdomen pain, burning with urination, blood in urine) Denies: abdominal pain, numbness/tingling, fever     This RN advised in-person evaluation. No availability with PCP office. Patient has been scheduled for appointment with alternate office within region. Office location reviewed with patient.   Reason for Disposition  [1] MODERATE back pain (e.g., interferes with normal activities) AND [2] present > 3 days  Protocols used: Back Pain-A-AH  Summary: Fall   Reason for Triage: Patient fell one month ago and back pain is worsening. Patient CB# [phone]

## 2024-03-02 ENCOUNTER — Ambulatory Visit: Admitting: Family Medicine

## 2024-03-02 ENCOUNTER — Ambulatory Visit: Payer: Self-pay | Admitting: Family Medicine

## 2024-03-02 ENCOUNTER — Ambulatory Visit (INDEPENDENT_AMBULATORY_CARE_PROVIDER_SITE_OTHER)

## 2024-03-02 ENCOUNTER — Encounter: Payer: Self-pay | Admitting: Family Medicine

## 2024-03-02 VITALS — BP 140/66 | HR 72 | Temp 97.3°F | Ht 62.0 in | Wt 165.4 lb

## 2024-03-02 DIAGNOSIS — M545 Low back pain, unspecified: Secondary | ICD-10-CM

## 2024-03-02 DIAGNOSIS — D849 Immunodeficiency, unspecified: Secondary | ICD-10-CM

## 2024-03-02 DIAGNOSIS — Z940 Kidney transplant status: Secondary | ICD-10-CM | POA: Diagnosis not present

## 2024-03-02 DIAGNOSIS — R82998 Other abnormal findings in urine: Secondary | ICD-10-CM

## 2024-03-02 LAB — POC URINALSYSI DIPSTICK (AUTOMATED)
Bilirubin, UA: NEGATIVE
Blood, UA: NEGATIVE
Glucose, UA: NEGATIVE
Ketones, UA: NEGATIVE
Nitrite, UA: NEGATIVE
Protein, UA: POSITIVE — AB
Spec Grav, UA: 1.015
Urobilinogen, UA: NEGATIVE U/dL — AB
pH, UA: 6

## 2024-03-02 NOTE — Patient Instructions (Signed)
 Worse. Call   Heat/ice.  tylenol

## 2024-03-04 LAB — UNLABELED: Test Ordered On Req: 395

## 2024-03-05 ENCOUNTER — Telehealth (HOSPITAL_BASED_OUTPATIENT_CLINIC_OR_DEPARTMENT_OTHER): Payer: Self-pay | Admitting: *Deleted

## 2024-03-05 LAB — PAT ID TIQ DOC

## 2024-03-05 NOTE — Progress Notes (Signed)
 D/w lab-urine discarded.  Will need to repeat the culture.

## 2024-03-05 NOTE — Telephone Encounter (Signed)
 Copied from CRM [id number]. Topic: Clinical - Request for Lab/Test Order >> Mar 05, 2024 11:20 AM Treva T wrote: Reason for CRM: Tisa calling with [employer], reports a urine specimen was received unlabeled for urine culture.  Requesting office fax number, to office.  Reports will be sending a fax to office in this regard to be completed and returned.  If need to speak further or advise further, can be reached at [phone]

## 2024-03-05 NOTE — Telephone Encounter (Signed)
 Pt was seen by Dr. Wendolyn for a visit 2/3 and looks like the urine was collected at her office. Routing this to her for review.

## 2024-05-24 ENCOUNTER — Ambulatory Visit (HOSPITAL_BASED_OUTPATIENT_CLINIC_OR_DEPARTMENT_OTHER): Admitting: Family Medicine
